# Patient Record
Sex: Female | Born: 1990
Health system: Southern US, Community
[De-identification: ages and names within clinical notes are randomized; demographics above are authoritative.]

## PROBLEM LIST (undated history)

## (undated) ENCOUNTER — Inpatient Hospital Stay (HOSPITAL_COMMUNITY): Payer: Self-pay

## (undated) DIAGNOSIS — A5601 Chlamydial cystitis and urethritis: Secondary | ICD-10-CM

## (undated) DIAGNOSIS — O139 Gestational [pregnancy-induced] hypertension without significant proteinuria, unspecified trimester: Secondary | ICD-10-CM

## (undated) DIAGNOSIS — Z8679 Personal history of other diseases of the circulatory system: Secondary | ICD-10-CM

## (undated) DIAGNOSIS — E041 Nontoxic single thyroid nodule: Secondary | ICD-10-CM

## (undated) DIAGNOSIS — D649 Anemia, unspecified: Secondary | ICD-10-CM

## (undated) DIAGNOSIS — R350 Frequency of micturition: Secondary | ICD-10-CM

## (undated) DIAGNOSIS — R011 Cardiac murmur, unspecified: Secondary | ICD-10-CM

## (undated) HISTORY — DX: Chlamydial cystitis and urethritis: A56.01

## (undated) HISTORY — DX: Gestational (pregnancy-induced) hypertension without significant proteinuria, unspecified trimester: O13.9

## (undated) HISTORY — DX: Nontoxic single thyroid nodule: E04.1

## (undated) HISTORY — PX: ASD REPAIR: SHX258

## (undated) HISTORY — DX: Frequency of micturition: R35.0

## (undated) HISTORY — DX: Cardiac murmur, unspecified: R01.1

## (undated) HISTORY — DX: Personal history of other diseases of the circulatory system: Z86.79

---
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
## 2010-05-24 ENCOUNTER — Other Ambulatory Visit
Admission: RE | Admit: 2010-05-24 | Discharge: 2010-05-24 | Payer: Self-pay | Source: Home / Self Care | Admitting: Family Medicine

## 2010-05-24 ENCOUNTER — Ambulatory Visit: Admission: RE | Admit: 2010-05-24 | Discharge: 2010-05-24 | Payer: Self-pay | Source: Home / Self Care

## 2010-05-24 ENCOUNTER — Encounter: Payer: Self-pay | Admitting: Family Medicine

## 2010-05-24 DIAGNOSIS — R350 Frequency of micturition: Secondary | ICD-10-CM

## 2010-05-24 HISTORY — DX: Frequency of micturition: R35.0

## 2010-05-24 LAB — CONVERTED CEMR LAB
Beta hcg, urine, semiquantitative: NEGATIVE
Bilirubin Urine: NEGATIVE
Blood in Urine, dipstick: NEGATIVE
Chlamydia, DNA Probe: POSITIVE — AB
GC Probe Amp, Genital: NEGATIVE
Glucose, Urine, Semiquant: NEGATIVE
Ketones, urine, test strip: NEGATIVE
Nitrite: NEGATIVE
Protein, U semiquant: NEGATIVE
Specific Gravity, Urine: 1.025
Urobilinogen, UA: 0.2
Whiff Test: POSITIVE
pH: 7

## 2010-05-27 LAB — GLUCOSE, CAPILLARY: Glucose-Capillary: 113 mg/dL — ABNORMAL HIGH (ref 70–99)

## 2010-05-28 ENCOUNTER — Encounter: Payer: Self-pay | Admitting: *Deleted

## 2010-05-28 ENCOUNTER — Ambulatory Visit
Admission: RE | Admit: 2010-05-28 | Discharge: 2010-05-28 | Payer: Self-pay | Source: Home / Self Care | Attending: Family Medicine | Admitting: Family Medicine

## 2010-05-28 DIAGNOSIS — A5601 Chlamydial cystitis and urethritis: Secondary | ICD-10-CM

## 2010-05-28 HISTORY — DX: Chlamydial cystitis and urethritis: A56.01

## 2010-06-11 NOTE — Assessment & Plan Note (Signed)
Summary: cold sxs/[place]   Vital Signs:  Patient profile:   20 year old female Weight:      158.9 pounds Temp:     98.6 degrees F oral Pulse rate:   66 / minute BP sitting:   109 / 69  (left arm) Cuff size:   regular  Vitals Entered By: Loralee Pacas CMA (June 14, 2009 3:31 PM)  Does patient need assistance? Functional Status Shopping Comments head congestion   History of Present Illness: 20 yo with 2 days history of nasal congestion.  Some sneezing, sore throat but no cough, sob, fever, nausea/vomting, diarrhea, or myalgias.  Has not tried much OTC.    Allergies: No Known Drug Allergies PMH-FH-SH reviewed-no changes except otherwise noted  Review of Systems      See HPI General:  Complains of fatigue; denies chills and fever. ENT:  Complains of nasal congestion, postnasal drainage, and sore throat; denies difficulty swallowing, hoarseness, and sinus pressure. CV:  Denies chest pain or discomfort. Resp:  Denies chest discomfort, cough, and shortness of breath. GI:  Denies abdominal pain, diarrhea, nausea, and vomiting.  Physical Exam  General:  Well-developed,well-nourished,in no acute distress; alert,appropriate and cooperative throughout examination Nose:  nasal congestion with boggy turbinates Mouth:  mild erythema with no tonsilar edema or exudate.  Evidence of post-nasal drip. Neck:  No deformities, masses, or tenderness noted. Lungs:  Normal respiratory effort, chest expands symmetrically. Lungs are clear to auscultation, no crackles or wheezes. Heart:  Normal rate and regular rhythm. S1 and S2 normal without gallop, murmur, click, rub or other extra sounds.   Impression & Recommendations:  Problem # 1:  URI (ICD-465.9)  URI with postnasal drip as most bothersome symptom.  Advised nasal saline, talked abotu OTC remedies as per instructions.  patient states she cannot afford anythign so picked claritin D which is on medicaid preferred list.  Her updated  medication list for this problem includes:    Claritin-d 12 Hour 5-120 Mg Xr12h-tab (Loratadine-pseudoephedrine) .Marland Kitchen... Take one tablet every 12 hours as needed for nasal congestion   Update:  received phone call from patient saying that pharmacy says it is no longer covered.  Advised to buy Claritin D over the counter.  Orders: [facility]- Est Level  3 (99213)  Complete Medication List: 1)  Loestrin 24 Fe 1-20 Mg-mcg Tabs (Norethin ace-eth estrad-fe) .... One daily 2)  Claritin-d 12 Hour 5-120 Mg Xr12h-tab (Loratadine-pseudoephedrine) .... Take one tablet every 12 hours as needed for nasal congestion  Patient Instructions: 1)  Try nasal saline rinse (neti pot) available at [facility]/ [facility]. 2)  May try decongestants +/- allergy medication ( i have prescribed Claritin-D) 3)  May try Afrin spray but not more then 2 days otherwise risk rebound symptoms. 4)  Stay well hydrated. Prescriptions: CLARITIN-D 12 HOUR 5-120 MG XR12H-TAB (LORATADINE-PSEUDOEPHEDRINE) take one tablet every 12 hours as needed for nasal congestion  #30 x 1   Entered and Authorized by:   Delbert Harness MD   Signed by:   Delbert Harness MD on 06/14/2009   Method used:   Print then Give to Patient   RxID:   [id number]

## 2010-06-11 NOTE — Assessment & Plan Note (Signed)
Summary: yeast inf,tcb   Vital Signs:  Patient profile:   20 year old female Weight:      154.6 pounds Temp:     98.9 degrees F oral Pulse rate:   80 / minute Pulse rhythm:   regular BP sitting:   130 / 83  (left arm) Cuff size:   regular  Vitals Entered By: Loralee Pacas CMA (August 23, 2009 9:47 AM)  History of Present Illness: 20 yo with one day of vaginal bumps.  + itching.  Feels irritated.  Sexually active with same female partner for 2.5 years.  He has no other partners.  No h/o same. Taking Loestrin daily.  No problems.  Had considered Implanon.    Allergies: No Known Drug Allergies  Social History: Occupation:Works at [employer], attends [employer].  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  vitals noted. Genitalia:  Normal external genitalia.  Fine papules at introitus with increased erythema of vagina.  No discharge noted. Speculum exam not performed.   Impression & Recommendations:  Problem # 1:  VAGINAL DISCHARGE (ICD-623.5) Due to yeast. Rx with fluconazole.  Pt med done on meds and disease.   Orders: Wet Prep- [facility] (16109) [facility]- Est Level  3 (60454)  Problem # 2:  CONTRACEPTIVE MANAGEMENT (ICD-V25.09)  Discussed Implanon, but pt does not want bleeding side effects.  Happy with Loestrin.  Will continue.  Orders: [facility]- Est Level  3 (99213)  Complete Medication List: 1)  Loestrin 24 Fe 1-20 Mg-mcg Tabs (Norethin ace-eth estrad-fe) .... One daily 2)  Claritin-d 12 Hour 5-120 Mg Xr12h-tab (Loratadine-pseudoephedrine) .... Take one tablet every 12 hours as needed for nasal congestion 3)  Fluconazole 150 Mg Tabs (Fluconazole) .... Take 1 pill now.  may repeat with another pill in 3 days if no relief  Patient Instructions: 1)  You  have a yeast infection.  Take the fluconazole.  If you are not better in 3 days, you may take the second one. 2)  Please call us if you have any concerns. 3)  Good luck with school! Prescriptions: FLUCONAZOLE 150 MG TABS (FLUCONAZOLE) Take 1 pill now.  May repeat with another pill in 3 days if no relief  #2 x 0   Entered and Authorized by:   Sarah Swaziland MD   Signed by:   Sarah Swaziland MD on 08/23/2009   Method used:   Electronically to        [facility] (retail)       [address]       [place], [place]  [id number]       Ph: [phone]       Fax: [phone]   RxID:   [id number]   Laboratory Results  Date/Time Received: August 23, 2009 10:07 AM  Date/Time Reported: August 23, 2009 10:13 AM   Wet [place] Source: vag WBC/hpf: 1-5 Bacteria/hpf: 2+  Rods Clue cells/hpf: none  Negative whiff Yeast/hpf: moderate Trichomonas/hpf: none Comments: ...............test performed by......Marland KitchenBonnie A. Swaziland, MLS (ASCP)cm

## 2010-06-11 NOTE — Consult Note (Signed)
Summary: [facility]   Imported By: Bradly Bienenstock 08/16/2009 12:03:37  _____________________________________________________________________  External Attachment:    Type:   Image     Comment:   External Document

## 2010-06-11 NOTE — Progress Notes (Signed)
Summary: Rx Prob  Phone Note Call from Patient   Caller: Patient Summary of Call: Pt at pharmacy and the pharmacy is saying they do not have her rx from today there as of yet. Initial call taken by: Clydell Hakim,  August 23, 2009 11:06 AM  Follow-up for Phone Call         called pharmacy and Rx verbally given to pharmacist for fluconazole . patient notified. Theresia Lo RN  August 23, 2009 11:35 AM  Follow-up by: Theresia Lo RN,  August 23, 2009 11:35 AM

## 2010-06-11 NOTE — Assessment & Plan Note (Signed)
Summary: sore throat,df   Vital Signs:  Patient profile:   20 year old female Height:      63.25 inches Weight:      162 pounds BMI:     28.57 BSA:     1.77 Temp:     98.3 degrees F Pulse rate:   69 / minute BP sitting:   119 / 74  Vitals Entered By: Jone Baseman CMA (December 26, 2009 11:50 AM) CC: sore throat, URI symptoms Is Patient Diabetic? No Pain Assessment Patient in pain? yes     Location: throat Intensity: 6   CC:  sore throat and URI symptoms.  History of Present Illness:       This is a 20 year old woman who presents with URI symptoms.  The patient complains of nasal congestion, sore throat, and sick contacts, but denies dry cough, productive cough, and earache.  Associated symptoms include low-grade fever (<100.5 degrees).  The patient denies stiff neck, dyspnea, wheezing, rash, vomiting, and diarrhea.  The patient also reports headache.  The patient denies itchy watery eyes, itchy throat, sneezing, seasonal symptoms, muscle aches, and severe fatigue.  The patient denies the following risk factors for Strep sinusitis: unilateral facial pain, tooth pain, and Strep exposure.    Habits & Providers  Alcohol-Tobacco-Diet     Tobacco Status: never  Current Problems (verified): 1)  Sore Throat  (ICD-462) 2)  Vaginal Discharge  (ICD-623.5) 3)  Uri  (ICD-465.9) 4)  Atrial Septal Defect, Hx of  (ICD-V12.59) 5)  Lesion, Face  (ICD-238.2) 6)  Thyroid Nodule  (ICD-241.0) 7)  Contraceptive Management  (ICD-V25.09)  Current Medications (verified): 1)  Loestrin 24 Fe 1-20 Mg-Mcg Tabs (Norethin Ace-Eth Estrad-Fe) .... One Daily 2)  Claritin-D 12 Hour 5-120 Mg Xr12h-Tab (Loratadine-Pseudoephedrine) .... Take One Tablet Every 12 Hours As Needed For Nasal Congestion 3)  Fluconazole 150 Mg Tabs (Fluconazole) .... Take 1 Pill Now.  May Repeat With Another Pill in 3 Days If No Relief  Allergies (verified): No Known Drug Allergies  Past History:  Past Medical  History: Last updated: 06/11/2009 Murmur  Past Surgical History: Last updated: 06/11/2009 ASD repair 2008  Social History: Last updated: 08/23/2009 Occupation:Works at [employer], attends [employer].  Wants to be a nurse practitioner Single Never Smoked Alcohol use-no Drug use-no Regular exercise-yes  Risk Factors: Exercise: yes (06/11/2009)  Risk Factors: Smoking Status: never (12/26/2009)  Review of Systems  The patient denies anorexia, weight loss, hoarseness, chest pain, syncope, dyspnea on exertion, prolonged cough, hemoptysis, abdominal pain, hematochezia, and severe indigestion/heartburn.    Physical Exam  General:  alert, well-developed, and well-nourished.   Head:  normocephalic and atraumatic.   Ears:  R ear normal and L ear normal.   Nose:  no external deformity and mucosal erythema.   Mouth:  good dentition, no exudates, pharyngeal erythema, and postnasal drip.   Neck:  supple.   Lungs:  normal respiratory effort and normal breath sounds.   Heart:  normal rate and regular rhythm.   Abdomen:  soft.     Impression & Recommendations:  Problem # 1:  SORE THROAT (ICD-462) Negative strep test Orders: Rapid Strep-[facility] (25366) [facility]- Est Level  3 (44034)  Problem # 2:  URI (ICD-465.9)  Her updated medication list for this problem includes:    Claritin-d 12 Hour 5-120 Mg Xr12h-tab (Loratadine-pseudoephedrine) .Marland Kitchen... Take one tablet every 12 hours as needed for nasal congestion  Orders: [facility]- Est Level  3 (74259)  Complete Medication List: 1)  Loestrin 24 Fe 1-20 Mg-mcg Tabs (Norethin ace-eth estrad-fe) .... One daily 2)  Claritin-d 12 Hour 5-120 Mg Xr12h-tab (Loratadine-pseudoephedrine) .... Take one tablet every 12 hours as needed for nasal congestion 3)  Fluconazole 150 Mg Tabs (Fluconazole) .... Take 1 pill now.  may repeat with another pill in 3 days if no relief  Patient Instructions: 1)  Please schedule a follow-up appointment as needed .   Laboratory  Results  Date/Time Received: December 26, 2009 11:47 AM  Date/Time Reported: December 26, 2009 11:57 AM   Other Tests  Rapid Strep: negative Comments: ...........test performed by...........Marland KitchenTerese Door, CMA

## 2010-06-11 NOTE — Progress Notes (Signed)
Summary: triage  Phone Note Call from Patient Call back at Home Phone [phone]   Caller: Patient Summary of Call: tired/stuffy nose/cough/lightheaded -wants to come in today Initial call taken by: De Nurse,  June 14, 2009 1:35 PM  Follow-up for Phone Call        she will be here by 3 for a work in. aware of wait Follow-up by: Golden Circle RN,  June 14, 2009 2:27 PM

## 2010-06-11 NOTE — Assessment & Plan Note (Signed)
Summary: np,df   Vital Signs:  Patient profile:   20 year old female Height:      63.25 inches Weight:      158.8 pounds BMI:     28.01 Pulse rate:   72 / minute BP sitting:   130 / 79  (right arm)  Vitals Entered By: Arlyss Repress CMA, (June 11, 2009 1:34 PM) CC: discuss birth control. has been on loestrin before, but wants implanon. LMP 05-25-09. uses condoms. has been sexually active since age 27. Is Patient Diabetic? No Pain Assessment Patient in pain? no        CC:  discuss birth control. has been on loestrin before and but wants implanon. LMP 05-25-09. uses condoms. has been sexually active since age 59.Marland Kitchen  History of Present Illness: Patient office for physical and discuss and to dicuss birth control, states she does not wish to re-implement Loestrin that she had been previously prescribed, wishes to have implanon inserted.  Currently sexually active, in monogamous relationship and denies vaginal d/c, irritation or abnormal vaginal bleeding.  Reports recurrent lesion to nose, that begans as a small itchy bump and develops into blisters that last for a couple of days, previously seen by out of town physician and was checked for HSV resulting negative.  States she has been using "cream" prescribed by previous physician.  Patient also reports she was seen in ER Nov 2010, for persistent chest pain, has a history of ASD repair in 2008, states that CT report showed nodules on her thyroid.  Denies heat/cold intolerances, difficulty swallowing, polyuria, or polydipsia.    Habits & Providers  Alcohol-Tobacco-Diet     Tobacco Status: never  Exercise-Depression-Behavior     Does Patient Exercise: yes     Drug Use: no  Current Medications (verified): 1)  Loestrin 24 Fe 1-20 Mg-Mcg Tabs (Norethin Ace-Eth Estrad-Fe) .... One Daily  Allergies (verified): No Known Drug Allergies  Past History:  Past Medical History: Murmur  Past Surgical History: ASD repair 2008  Family  History: Family History Hypertension  Social History: Occupation:Works at [employer], attends [employer] Single Never Smoked Alcohol use-no Drug use-no Regular exercise-yes Smoking Status:  never Occupation:  employed Drug Use:  no Does Patient Exercise:  yes  Review of Systems General:  Denies chills, fatigue, fever, loss of appetite, malaise, and weakness. Eyes:  Denies blurring, halos, and vision loss-both eyes. ENT:  Denies decreased hearing, difficulty swallowing, ear discharge, nasal congestion, postnasal drainage, sinus pressure, and sore throat. CV:  Denies chest pain or discomfort, difficulty breathing at night, fainting, fatigue, and shortness of breath with exertion. Resp:  Denies chest discomfort, cough, shortness of breath, and wheezing. GI:  Denies abdominal pain, change in bowel habits, constipation, excessive appetite, nausea, and vomiting. GU:  Denies abnormal vaginal bleeding, discharge, dysuria, and urinary frequency. MS:  Denies joint pain, loss of strength, and muscle weakness. Derm:  Complains of lesion(s); denies changes in color of skin, changes in nail beds, hair loss, and itching; recurrent bump on nose. Neuro:  Denies difficulty with concentration, numbness, poor balance, tingling, and visual disturbances. Psych:  Denies anxiety, depression, irritability, and panic attacks. Endo:  Denies cold intolerance, excessive hunger, excessive thirst, and heat intolerance. Heme:  Denies abnormal bruising, bleeding, and pallor. Allergy:  Denies itching eyes and seasonal allergies.  Physical Exam  General:  Well-developed,well-nourished,in no acute distress; alert,appropriate and cooperative throughout examination Head:  Normocephalic and atraumatic without obvious abnormalities. No apparent alopecia or balding. Eyes:  No corneal or  conjunctival inflammation noted. EOMI. Perrla. Vision grossly normal. Ears:  External ear exam shows no significant lesions or  deformities.  Otoscopic examination reveals moderate amount of cerumen bilaterally. Hearing is grossly normal bilaterally. Nose:  External nasal examination shows no deformity or inflammation. Nasal mucosa are pink and moist without lesions or exudates. Mouth:  Oral mucosa and oropharynx without lesions or exudates.  Teeth in good repair. Neck:  No deformities, masses, or tenderness noted. Chest Wall:  No deformities, masses, or tenderness noted. Breasts:  No mass, nodules, thickening, tenderness, bulging, retraction, inflamation, nipple discharge or skin changes noted.   Lungs:  Normal respiratory effort, chest expands symmetrically. Lungs are clear to auscultation, no crackles or wheezes. Heart:  Normal rate and regular rhythm. S1 and S2 normal without gallop, murmur, click, rub or other extra sounds. Abdomen:  Bowel sounds positive,abdomen soft and non-tender without masses, organomegaly or hernias noted. Rectal:  deferred Genitalia:  deferred Msk:  No deformity or scoliosis noted of thoracic or lumbar spine.   Pulses:  R and L carotid,radial,femoral,dorsalis pedis and posterior tibial pulses are full and equal bilaterally Extremities:  No clubbing, cyanosis, edema, or deformity noted with normal full range of motion of all joints.   Neurologic:  No cranial nerve deficits noted. Station and gait are normal. Plantar reflexes are down-going bilaterally. DTRs are symmetrical throughout. Sensory, motor and coordinative functions appear intact. Skin:  small, hyperpigmented area on nasal bridge, same texture as surrounding skin Cervical Nodes:  No lymphadenopathy noted Axillary Nodes:  No palpable lymphadenopathy Psych:  Cognition and judgment appear intact. Alert and cooperative with normal attention span and concentration. No apparent delusions, illusions, hallucinations    Impression & Recommendations:  Problem # 1:  CONTRACEPTIVE MANAGEMENT (ICD-V25.09) After discussing contraception  methods, patient decided to restart Loestrin, urine pregnancy negative, instructed to use back up method throughout first pack.  Orders: U Preg-[facility] ([id number])  Problem # 2:  THYROID NODULE (ICD-241.0) CT Scan results on 04/02/2009 shows "multinodular goiter".  Will obtain TSH to evaluate thyroid function, no masses palpated, will have patient follow up within 3 months.  Problem # 3:  LESION, FACE (ICD-238.2) Recurrent lesion to nasal bridge.  Will refer to Dermatology.  Complete Medication List: 1)  Loestrin 24 Fe 1-20 Mg-mcg Tabs (Norethin ace-eth estrad-fe) .... One daily  Other Orders: TSH-[facility] ([id number]) Dermatology Referral (Derm[facility])  Patient Instructions: 1)  Take birth control as prescribed. 2)  Follow up with Dermatology Referral.  We will call you with lab results and Derm appt. 3)  Return to office for follow up in 3 months. Prescriptions: LOESTRIN 24 FE 1-20 MG-MCG TABS (NORETHIN ACE-ETH ESTRAD-FE) one daily Brand medically necessary #1 x 11   Entered and Authorized by:   Luretha Murphy NP   Signed by:   Luretha Murphy NP on 06/11/2009   Method used:   Print then Give to Patient   RxID:   [id number]   Laboratory Results   Urine Tests  Date/Time Received: June 11, 2009 2:45 PM  Date/Time Reported: June 11, 2009 2:56 PM     Urine HCG: negative Comments: ...........test performed by...........Marland KitchenTerese Door, CMA     CBC   HGB:  14.6 g/dL   (Normal Range: 44.0-10.2 in Males, 12.0-15.0 in Females) Hct:  43.0 %   (Normal Range: 36.0-46.0)      CT of Chest  Procedure date:  03/29/2009  Findings:      Neg for PE, Cardiomegaly with right atrial enlargement, consistent with history of  ASD, multinnodular goiter

## 2010-06-13 NOTE — Assessment & Plan Note (Signed)
Summary: std tx,df  Nurse Visit   Allergies: No Known Drug Allergies  Medication Administration  Medication # 1:    Medication: Azithromycin oral    Diagnosis: CHLAMYDIAL INFECTION (ICD-099.41)    Dose: 1 gram    Route: po    Exp Date: 08/11/2011    Lot #: [id number]    Mfr: pfizer    Comments: patient advised to abstain from sex for 7 days , tell partner to be treated and always use condoms to prevent STD    Patient tolerated medication without complications    Given by: Theresia Lo RN (May 28, 2010 2:24 PM)  Orders Added: 1)  Azithromycin oral [Q0144] 2)  Est Level 1- [facility] [45409]   Medication Administration  Medication # 1:    Medication: Azithromycin oral    Diagnosis: CHLAMYDIAL INFECTION (ICD-099.41)    Dose: 1 gram    Route: po    Exp Date: 08/11/2011    Lot #: [id number]    Mfr: pfizer    Comments: patient advised to abstain from sex for 7 days , tell partner to be treated and always use condoms to prevent STD    Patient tolerated medication without complications    Given by: Theresia Lo RN (May 28, 2010 2:24 PM)  Orders Added: 1)  Azithromycin oral [Q0144] 2)  Est Level 1- [facility] [[id number]]

## 2010-06-13 NOTE — Assessment & Plan Note (Signed)
Summary: physical/pap/bmc   Vital Signs:  Patient profile:   20 year old female Height:      63.25 inches Weight:      174.56 pounds BMI:     30.79 BSA:     1.83 Temp:     98.7 degrees F Pulse rate:   76 / minute BP sitting:   120 / 78  Vitals Entered By: JessicaFleeger,CMA (May 24, 2010 10:49 AM) CC: physical Is Patient Diabetic? No Pain Assessment Patient in pain? no        CC:  physical.  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  Makes the Dean's list.  Has had a boyfriend for 4 years, sexually active for that time.  Had a PAP in the past that was abnormal.  Quit taking OCP as she had some breakthrough bleeding but had missed some pills.  Has a vaginal discharge.   Has been excessively thirsty and has had frequent urination.  SHe is trying to loose weight, going to the gym.  + DM in family.  Habits & Providers  Alcohol-Tobacco-Diet     Alcohol drinks/day: 0     Tobacco Status: never     Diet Counseling: to improve diet; diet is suboptimal  Exercise-Depression-Behavior     Does Patient Exercise: yes     Drug Use: never     Seat Belt Use: always  Allergies: No Known Drug Allergies  Social History: Drug Use:  never Risk analyst Use:  always  Review of Systems      See HPI  Physical Exam  General:  Well-developed,well-nourished,in no acute distress; alert,appropriate and cooperative throughout examination Ears:  R ear normal and L ear normal.   Nose:  nose piercing noted.   Mouth:  good dentition and pharynx pink and moist.   Neck:  no masses.   Lungs:  normal respiratory effort and normal breath sounds.   Heart:  normal rate, regular rhythm, and no murmur.   Abdomen:  soft, non-tender, and normal bowel sounds.   Genitalia:  normal introitus, no external lesions, mucosa pink and moist, no vaginal or cervical lesions, normal uterus size and position, no adnexal masses or tenderness, and  vaginal discharge.     Impression & Recommendations:  Problem # 1:  SCREENING FOR MALIGNANT NEOPLASM OF THE CERVIX (ICD-V76.2)  Orders: Pap Smear-[facility] (81191-47829) [facility] - Est  20-39 yrs (56213)  Problem # 2:  URINARY FREQUENCY (ICD-788.41) serum glucose non fasting was 113 (discussed a little high), clues in urine Orders: Urinalysis-[facility] (00000) Glucose Cap-[facility] (08657) [facility] - Est  20-39 yrs (84696)  Problem # 3:  VAGINITIS, BACTERIAL (ICD-616.10) await GC and CT Her updated medication list for this problem includes:    Metronidazole 500 Mg Tabs (Metronidazole) ..... One two times a day for 7 days  Problem # 4:  LESION, FACE (ICD-238.2) seen by derm and resolved  Problem # 5:  THYROID NODULE (ICD-241.0) incidental finding on CXR  Problem # 6:  CONTRACEPTIVE MANAGEMENT (ICD-V25.09) resume OCPs Orders: U Preg-[facility] (81025) [facility] - Est  20-39 yrs (29528)  Complete Medication List: 1)  Loestrin 24 Fe 1-20 Mg-mcg Tabs (Norethin ace-eth estrad-fe) .... One daily 2)  Metronidazole 500 Mg Tabs (Metronidazole) .... One two times a day for 7 days  [phone] GC/Chlamydia-[facility] (87591/87491)  Patient Instructions: 1)  If you could be exposed to sexually transmitted diseases. you should use a condom.  2)  If you are having sex and you or your partner don't want a child. Use contraception.  3)  You need to lose weight. Consider a lower calorie diet and regular exercise.  4)  It is important that you exercise reguarly at least 20 minutes 5 times a week. Prescriptions: METRONIDAZOLE 500 MG TABS (METRONIDAZOLE) one two times a day for 7 days  #14 x 0   Entered and Authorized by:   Luretha Murphy NP   Signed by:   Luretha Murphy NP on 05/24/2010   Method used:   Print then Give to Patient   RxID:   [id number] LOESTRIN 24 FE 1-20 MG-MCG TABS (NORETHIN ACE-ETH ESTRAD-FE) one daily  #1 x 11   Entered and Authorized by:   Luretha Murphy NP   Signed by:   Luretha Murphy NP  on 05/24/2010   Method used:   Print then Give to Patient   RxID:   [id number]    Orders Added: 1)  Urinalysis-[facility] [00000] 2)  Wet Prep- [facility] [87210] 3)  GC/Chlamydia-[facility] [87591/87491] 4)  Glucose Cap-[facility] [82948] 5)  U Preg-[facility] [81025] 6)  Pap Smear-[facility] [84132-44010] 7)  [facility] - Est  20-39 yrs [99395]    Laboratory Results   Urine Tests  Date/Time Received: May 24, 2010 10:55 AM  Date/Time Reported: May 24, 2010 11:47 AM   Routine Urinalysis   Color: yellow Appearance: Clear Glucose: negative   (Normal Range: Negative) Bilirubin: negative   (Normal Range: Negative) Ketone: negative   (Normal Range: Negative) Spec. Gravity: 1.025   (Normal Range: 1.003-1.035) Blood: negative   (Normal Range: Negative) pH: 7.0   (Normal Range: 5.0-8.0) Protein: negative   (Normal Range: Negative) Urobilinogen: 0.2   (Normal Range: 0-1) Nitrite: negative   (Normal Range: Negative) Leukocyte Esterace: trace   (Normal Range: Negative)  Urine Microscopic WBC/HPF: 10-20 RBC/HPF: 0-3 Bacteria/HPF: 2+ cocci Epithelial/HPF: 10-20 with mod #clue cells present    Urine HCG: negative Comments: ...............test performed by......Marland KitchenBonnie A. [place], MLS (ASCP)cm  Date/Time Received: May 24, 2010 10:55 AM  Date/Time Reported: May 24, 2010 11:48 AM   [place] Source: vag WBC/hpf: >20 Bacteria/hpf: 3+  Cocci Clue cells/hpf: many  Positive whiff Yeast/hpf: none Trichomonas/hpf: none Comments: ...............test performed by......Marland KitchenBonnie A. [place], MLS (ASCP)cm

## 2010-06-13 NOTE — Miscellaneous (Signed)
Summary: re: pos test/TS  called pt. informed of positive chlamydia. advised to have partner treated too. pt agreed. will come in for treatment on nurse visit today at 2:30pm.  Arlyss Repress CMA,  May 28, 2010 11:31 AM fwd. to [facility] for tx order   Administer Azithromycin 1 GM by mouth for treatment of CT infection Luretha Murphy NP  May 28, 2010 1:27 PM

## 2010-06-19 ENCOUNTER — Encounter: Payer: Self-pay | Admitting: *Deleted

## 2010-08-02 ENCOUNTER — Encounter: Payer: Self-pay | Admitting: Family Medicine

## 2010-08-02 ENCOUNTER — Ambulatory Visit (INDEPENDENT_AMBULATORY_CARE_PROVIDER_SITE_OTHER): Payer: PRIVATE HEALTH INSURANCE | Admitting: Family Medicine

## 2010-08-02 VITALS — BP 112/70 | HR 60 | Temp 98.5°F | Ht 63.0 in | Wt 164.0 lb

## 2010-08-02 DIAGNOSIS — N760 Acute vaginitis: Secondary | ICD-10-CM

## 2010-08-02 DIAGNOSIS — E041 Nontoxic single thyroid nodule: Secondary | ICD-10-CM

## 2010-08-02 DIAGNOSIS — J309 Allergic rhinitis, unspecified: Secondary | ICD-10-CM

## 2010-08-02 DIAGNOSIS — A5601 Chlamydial cystitis and urethritis: Secondary | ICD-10-CM

## 2010-08-02 DIAGNOSIS — Z8679 Personal history of other diseases of the circulatory system: Secondary | ICD-10-CM

## 2010-08-02 LAB — POCT WET PREP (WET MOUNT)
Trichomonas Wet Prep HPF POC: NEGATIVE
Yeast Wet Prep HPF POC: NEGATIVE

## 2010-08-02 MED ORDER — FLUTICASONE PROPIONATE 50 MCG/ACT NA SUSP: 1.0000 | Freq: Every day | NASAL | Status: DC

## 2010-08-02 NOTE — Assessment & Plan Note (Signed)
Wet prep negative.  

## 2010-08-02 NOTE — Assessment & Plan Note (Signed)
Percutaneous repair, encouraged her to see her cardiologist when she goes home this summer.  Do not feel that the non exertional chest pain on the right is at all relted to her past ASD.  Did caution her that if she ever has exertional pain during exercise that is a different story.  Did also stress that she will likely need long term follow up with Echocardiograms with a cardiologist.

## 2010-08-02 NOTE — Assessment & Plan Note (Signed)
Add nasal steroid. 

## 2010-08-02 NOTE — Patient Instructions (Addendum)
Multinodular goiter Percutaneous closure of ASD, mild atrial enlarged-make apt with cardiologist to find out the long term follow up schedule for you  Goiter Goiter is an enlarged thyroid gland. The thyroid gland sits at the base of the front of the neck. The gland produces important hormones for the body that regulate mood, body temperature, pulse rate, digestion, and many other functions. Most goiters are painless and are not a cause for serious concern. Goiters and conditions that cause goiters can be treated if necessary.  CAUSES  Common causes of goiter include:  Graves disease (causes too much hormone to be produced [hyperthyroidism]).   Hashimoto's disease (causes too little hormone to be produced [hypothyroidism]).   Thyroiditis (inflammation of the gland sometimes due to virus or pregnancy).   Nodular goiter (small bumps form; sometimes called toxic nodular goiter).   Pregnancy.   Thyroid cancer (very small percentage of goiters with nodules are cancerous).   Certain medications.   Radiation exposure.   Iodine deficiency (more common in developing countries in inland populations).  Risk factors for goiter include:  Having a family history of goiter.   Being female.   Inadequate iodine in the diet.   Being older than 40 years.  SYMPTOMS Many goiters do not cause symptoms. When they do occur, symptoms may include:  Swelling in the lower part of the neck. This swelling can range from a very small bump to a large lump.   A tight feeling in the throat.   A hoarse voice.  Less commonly, a goiter may result in:  Coughing.   Wheezing.   Difficulty swallowing.   Difficulty breathing.   Bulging neck veins.   Dizziness.  When a goiter is the result of an overactive thyroid (hyperthyroidism), symptoms may include:  Rapid or irregular heart beat.   Feeling sick to your stomach (nauseous).   Vomiting.   Diarrhea.   Shaking.   Irritable feeling.    Bulging eyes.   Weight loss.   Heat sensitivity.   Anxiety.  When a goiter is the result of an underactive thyroid (hypothyroidism), symptoms may include:  Tiredness.   Dry skin.   Constipation.   Weight gain.   Irregular menstrual cycle.   Depressed mood.   Sensitivity to cold.  DIAGNOSIS Tests used to diagnose goiter include:  A physical exam.   Blood tests, including thyroid hormone levels and antibody testing.   Ultrasonography, computerized X-ray scan (computed tomography, CT) or computerized magnetic scan (magnetic resonance imaging, MRI).   Thyroid scan (imaging along with safe radioactive injection).   Tissue sample taken (biopsy) of nodules. This is sometimes done to confirm that the nodules are not cancerous.  TREATMENT Treatment will depend on the cause of the goiter. Treatment may include:  Monitoring. In some cases, no treatment is necessary, and your doctor will monitor you at regular check ups.   Medications and supplements. Thyroid medication (thyroid hormone replacement) is available for overactive and underactive thyroids.   If inflammation is the cause, over-the-counter medication or steroid medication may be recommended.   Goiters caused by iodine deficiency can be treated with iodine supplements or changes in diet.   Radioactive iodine treatment. Radioactive iodine is injected into the blood. It travels to the thyroid gland, kills thyroid cells, and reduces the size of the gland. This is only used when the thyroid gland is overactive. Lifelong thyroid hormone medication is often necessary after this treatment.   Surgery. A procedure to remove all or part of  the gland may be recommended in severe cases or when cancer is the cause. Hormones can be taken to replace the hormones normally produced by the thyroid.  HOME CARE INSTRUCTIONS   Take medications as directed.   Follow your caregiver's recommendations for any dietary changes.   Follow  up with your caregiver for further examination and testing, as directed.  PROGNOSIS Once the cause of the goiter is known, treatment to relieve symptoms or manage underlying conditions is usually very successful. PREVENTION   If you have a family history of goiter, discuss screening with your doctor.   Make sure you are getting enough iodine in your diet.   Use of iodized table salt can help prevent iodine deficiency.  SEEK MEDICAL CARE IF YOU EXPERIENCE ANY SYMPTOMS OF GOITER SEEK IMMEDIATE MEDICAL CARE IF:  You have difficulty swallowing.   You have difficulty breathing.   You have bulging neck veins.   You experience dizziness.   You develop a rapid or irregular heart beat.  MAKE SURE YOU:   Understand these instructions.   Watch your condition.   Will get help right away if you are not doing well or get worse.  Not all test results may be available during your visit. If your test results are not back during the visit, make an appointment with your caregiver to find out the results. Do not assume everything is normal if you have not heard from your caregiver or the medical facility. It is important for you to follow up on all of your test results.  Document Released: 10/16/2009  [facility] Patient Information 2011 [place], [place].

## 2010-08-02 NOTE — Assessment & Plan Note (Signed)
Retested per patient request

## 2010-08-02 NOTE — Progress Notes (Signed)
  Subjective:    Patient ID: Anna Sexton, female    DOB: 10-03-1990, 20 y.o.   MRN: [id number]  HPI:  Wants to be check for STDs had CT infection and was treated several months ago.  She is still seeing her boyfriend.  She has occasional right sided chest pain that comes and goes, she worries that it is related to her ASD repair.  She has not follow up with her cardiologist at home like she should.  She went to the ER in November 2010 and had a CT angio of her chest as she was complaining of pain.  It showed enlarged atria related to ASD that was long standing before percutaneous repair.  Her chest pain is not exertional, remains confined to the area around her bra line on the right.    An incidental finding was nodular thyroid on the CT angio and she worries about this.  She reports that her Mother has nodules as well.  She is not sure where or what the situation is.  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  She has seasonal allergies, congestion and some post nasal drainage.    Review of Systems  Constitutional: Negative for fever, activity change, appetite change and fatigue.  HENT: Positive for congestion and postnasal drip.   Respiratory: Negative for shortness of breath and wheezing.   Cardiovascular: Positive for chest pain. Negative for palpitations and leg swelling.  Gastrointestinal: Negative for nausea, vomiting, abdominal pain, diarrhea and blood in stool.  Genitourinary: Negative for dysuria, urgency, frequency, vaginal bleeding, vaginal discharge, menstrual problem and pelvic pain.  Neurological: Negative for dizziness, syncope, light-headedness and headaches.  Psychiatric/Behavioral: Negative for dysphoric mood.       Objective:   Physical Exam  Constitutional: She appears well-developed and well-nourished.  Neck:       Small right thyroid nodule  Cardiovascular: Normal rate, regular rhythm and normal heart sounds.  Exam reveals  no friction rub.   No murmur heard. Pulmonary/Chest: Effort normal and breath sounds normal.  Genitourinary: Vagina normal.       No cervical discharge or CMT          Assessment & Plan:

## 2010-08-02 NOTE — Assessment & Plan Note (Signed)
Gave reassurance, and written information

## 2010-08-03 LAB — GC/CHLAMYDIA PROBE AMP, GENITAL
Chlamydia, DNA Probe: NEGATIVE
GC Probe Amp, Genital: NEGATIVE

## 2010-08-05 ENCOUNTER — Encounter: Payer: Self-pay | Admitting: Family Medicine

## 2010-08-14 LAB — URINALYSIS, ROUTINE W REFLEX MICROSCOPIC
Bilirubin Urine: NEGATIVE
Glucose, UA: NEGATIVE mg/dL
Hgb urine dipstick: NEGATIVE
Ketones, ur: NEGATIVE mg/dL
Nitrite: NEGATIVE
Protein, ur: NEGATIVE mg/dL
Specific Gravity, Urine: 1.029 (ref 1.005–1.030)
Urobilinogen, UA: 0.2 mg/dL (ref 0.0–1.0)
pH: 7 (ref 5.0–8.0)

## 2010-08-14 LAB — POCT I-STAT, CHEM 8
BUN: 10 mg/dL (ref 6–23)
Calcium, Ion: 1.21 mmol/L (ref 1.12–1.32)
Chloride: 101 mEq/L (ref 96–112)
Creatinine, Ser: 0.7 mg/dL (ref 0.4–1.2)
Glucose, Bld: 86 mg/dL (ref 70–99)
HCT: 43 % (ref 36.0–46.0)
Hemoglobin: 14.6 g/dL (ref 12.0–15.0)
Potassium: 3.9 mEq/L (ref 3.5–5.1)
Sodium: 141 mEq/L (ref 135–145)
TCO2: 28 mmol/L (ref 0–100)

## 2010-08-14 LAB — POCT CARDIAC MARKERS
CKMB, poc: <1 ng/mL — ABNORMAL LOW (ref 1.0–8.0)
Myoglobin, poc: 24.2 ng/mL (ref 12–200)
Troponin i, poc: <0.05 ng/mL (ref 0.00–0.09)

## 2010-08-14 LAB — URINE MICROSCOPIC-ADD ON

## 2010-08-14 LAB — URINE CULTURE
Colony Count: NO GROWTH
Culture: NO GROWTH

## 2010-08-14 LAB — PREGNANCY, URINE: Preg Test, Ur: NEGATIVE

## 2010-09-20 ENCOUNTER — Ambulatory Visit (INDEPENDENT_AMBULATORY_CARE_PROVIDER_SITE_OTHER): Payer: PRIVATE HEALTH INSURANCE | Admitting: Family Medicine

## 2010-09-20 ENCOUNTER — Encounter: Payer: Self-pay | Admitting: Family Medicine

## 2010-09-20 VITALS — BP 130/83 | HR 73 | Temp 99.1°F | Ht 63.0 in | Wt 161.1 lb

## 2010-09-20 DIAGNOSIS — N760 Acute vaginitis: Secondary | ICD-10-CM

## 2010-09-20 DIAGNOSIS — A5601 Chlamydial cystitis and urethritis: Secondary | ICD-10-CM

## 2010-09-20 DIAGNOSIS — R350 Frequency of micturition: Secondary | ICD-10-CM

## 2010-09-20 LAB — POCT URINALYSIS DIPSTICK
Bilirubin, UA: NEGATIVE
Blood, UA: NEGATIVE
Glucose, UA: NEGATIVE
Ketones, UA: NEGATIVE
Leukocytes, UA: NEGATIVE
Nitrite, UA: NEGATIVE
Protein, UA: NEGATIVE
Spec Grav, UA: 1.025
Urobilinogen, UA: 1
pH, UA: 6.5

## 2010-09-20 LAB — POCT WET PREP (WET MOUNT)
Trichomonas Wet Prep HPF POC: NEGATIVE
Yeast Wet Prep HPF POC: NEGATIVE

## 2010-09-20 LAB — POCT URINE PREGNANCY: Preg Test, Ur: NEGATIVE

## 2010-09-20 MED ORDER — METRONIDAZOLE 500 MG PO TABS: ORAL_TABLET | ORAL | Status: DC

## 2010-09-20 NOTE — Progress Notes (Signed)
  Subjective:    Patient ID: Anna Sexton, female    DOB: [birth year], 20 y.o.   MRN: [id number]  HPI  Vaginitis: Patient complains of an abnormal vaginal discharge for a few days. Vaginal symptoms include none.Vulvar symptoms include odor.STI Risk: Possible STD exposure.  Discharge described as: thick and malodorous.Other associated symptoms: discharge described as creamy and malodorous.Menstrual pattern: She had been bleeding regularly. Contraception: none, she uses condoms inconsistently and is sexually active with 2 partners.  She had STD in the past, with the same partner. Seh states that he was treated.  LMP 4/18 - 4/22.  She is supposed to take Loestrin for contraception but does not take it.    No fever, chills, abd pain, dysuria, vaginal bleeding, vomiting, nausea. +Frequency + odor to urine.  Review of Systems Per hpi     Objective:   Physical Exam  Constitutional: She appears well-developed and well-nourished. No distress.  Abdominal: Soft. Bowel sounds are normal. She exhibits no distension. There is no tenderness.  Genitourinary: Uterus normal. There is no rash or lesion on the right labia. There is no rash or lesion on the left labia. Cervix exhibits discharge. Cervix exhibits no motion tenderness and no friability. Right adnexum displays no mass and no tenderness. Left adnexum displays no mass and no tenderness. No tenderness or bleeding around the vagina. Vaginal discharge found.  Lymphadenopathy:       Right: No inguinal adenopathy present.       Left: No inguinal adenopathy present.          Assessment & Plan:

## 2010-09-21 LAB — GC/CHLAMYDIA PROBE AMP, GENITAL
Chlamydia, DNA Probe: NEGATIVE
GC Probe Amp, Genital: NEGATIVE

## 2010-09-21 LAB — RPR

## 2010-09-21 LAB — HIV ANTIBODY (ROUTINE TESTING W REFLEX): HIV: NONREACTIVE

## 2010-09-21 NOTE — Assessment & Plan Note (Signed)
UA negative for UTI

## 2010-09-21 NOTE — Assessment & Plan Note (Addendum)
Upreg negative.  Wet prep +clue cells.  Will treat with Flagyl 500mg  bid.  Advised against Etoh use.  Will check GC/Chlam since pt uses condoms inconsistently and has history of Chlam.  Will also check for HIV and RPR.

## 2010-09-21 NOTE — Assessment & Plan Note (Signed)
History of Chlamydia in the past, with the same partner.  Pt is at risk of STD.  Will check HIV, RPR.  Culture for GC/Chlam.

## 2010-11-22 ENCOUNTER — Ambulatory Visit (INDEPENDENT_AMBULATORY_CARE_PROVIDER_SITE_OTHER): Payer: Self-pay | Admitting: Family Medicine

## 2010-11-22 ENCOUNTER — Encounter: Payer: Self-pay | Admitting: Family Medicine

## 2010-11-22 DIAGNOSIS — N760 Acute vaginitis: Secondary | ICD-10-CM

## 2010-11-22 DIAGNOSIS — Z7251 High risk heterosexual behavior: Secondary | ICD-10-CM

## 2010-11-22 LAB — HIV ANTIBODY (ROUTINE TESTING W REFLEX): HIV: NONREACTIVE

## 2010-11-22 MED ORDER — METRONIDAZOLE 500 MG PO TABS: ORAL_TABLET | ORAL | Status: DC

## 2010-11-22 NOTE — Patient Instructions (Signed)
You have bacterial vaginosis- take flagyl as directed.  I will mail you your lab results.  I will call you if additional medication needed.  Return as needed

## 2010-11-22 NOTE — Progress Notes (Signed)
  Subjective:    Patient ID: Anna Sexton, female    DOB: [birth year], 20 y.o.   MRN: [id number]  HPI Vaginal discharge- X 1 week, white in color, no itching. No odor. Has had unprotected sex approx 2 week. Has 1 partner. Don't use condoms regularly.     Review of Systems no fever. no rash. no h/a.  no abd pain.    Objective:   Physical Exam  Constitutional: She is oriented to person, place, and time. She appears well-developed and well-nourished.  Cardiovascular: Normal rate, regular rhythm and normal heart sounds.   No murmur heard. Pulmonary/Chest: Effort normal. No respiratory distress.  Abdominal: Soft. She exhibits no distension. There is no tenderness. There is no rebound.  Genitourinary: Vagina normal and uterus normal. There is no rash or tenderness on the right labia. There is no rash or tenderness on the left labia. Cervix exhibits discharge. Cervix exhibits no motion tenderness and no friability. Right adnexum displays no mass, no tenderness and no fullness. Left adnexum displays no mass, no tenderness and no fullness.  Musculoskeletal: Normal range of motion. She exhibits no edema.  Neurological: She is alert and oriented to person, place, and time.  Skin: No rash noted.          Assessment & Plan:

## 2010-11-23 LAB — GC/CHLAMYDIA PROBE AMP, GENITAL
Chlamydia, DNA Probe: NEGATIVE
GC Probe Amp, Genital: NEGATIVE

## 2010-11-23 LAB — RPR

## 2010-11-25 ENCOUNTER — Encounter: Payer: Self-pay | Admitting: Family Medicine

## 2010-11-26 NOTE — Assessment & Plan Note (Signed)
Whiff test +, most likely bacterial vaginitis per wet prep.  Will send off GC/Chlam cultures.  Pt also tested for HIV and RPR.  Also discussed safe sex practices with pt.

## 2010-12-02 ENCOUNTER — Telehealth: Payer: Self-pay | Admitting: Family Medicine

## 2010-12-02 NOTE — Telephone Encounter (Signed)
Patient dropped off school form to be filled out asap, placed in K Foster's office for any clinical completion.

## 2010-12-03 NOTE — Telephone Encounter (Signed)
Form completed except for V & H.  Patient has not officially had a physical so information obtained from a [place] appointment this month.  Patient also has immunizations due.  Will call her and have her come in for a nurse visit for shots and vision, hearing.

## 2010-12-03 NOTE — Telephone Encounter (Signed)
Scheduled patient for a nurse visit for tomorrow afternoon.

## 2011-03-05 ENCOUNTER — Encounter: Payer: Self-pay | Admitting: *Deleted

## 2011-03-06 ENCOUNTER — Encounter: Payer: Self-pay | Admitting: Cardiology

## 2011-03-06 ENCOUNTER — Ambulatory Visit (INDEPENDENT_AMBULATORY_CARE_PROVIDER_SITE_OTHER): Payer: PRIVATE HEALTH INSURANCE | Admitting: Cardiology

## 2011-03-06 DIAGNOSIS — Z8679 Personal history of other diseases of the circulatory system: Secondary | ICD-10-CM

## 2011-03-06 DIAGNOSIS — R079 Chest pain, unspecified: Secondary | ICD-10-CM

## 2011-03-06 NOTE — Assessment & Plan Note (Signed)
I will bring the patient back for a POET (Plain Old Exercise Test). This will allow me to screen for obstructive coronary disease, risk stratify and very importantly provide a prescription for exercise.  If this and the echo are normal, I would not suggest further cardiac testing.

## 2011-03-06 NOTE — Assessment & Plan Note (Signed)
I suspect a stable repair but I will check an echocardiogram.

## 2011-03-06 NOTE — Progress Notes (Signed)
HPI The patient presents for evaluation of chest pain.  She has a history of an ASD repair at age 20.  This was at [facility] via a left thoracotomy.  She was not a candidate for a closure device.  She has had follow up with cardiology in the past with her last echo in 2009 or 10.  She had a normal childhood but apparently had a murmur and may have been diagnosed with an ASD at a young age. She had a sports physical that led to followup and treatment as a teenager. Prior to that she been able to do usual activities and sports without limitations. She is now a Theatre stage manager. She has been noticing some chest discomfort for several weeks. This is fleeting at times and sharp. It is sporadic. She may also get some pressure with activity such as climbing up the stairs. She is not exercising. She does get more short of breath than she thinks she should stairs but she also admits to being "out of shape". She does not describe any neck or arm discomfort. She had some exercise-induced rapid heart rates but no resting palpitations, presyncope or syncope. She has no PND or orthopnea. She has had no weight gain or edema.  No Known Allergies  Current Outpatient Prescriptions  Medication Sig Dispense Refill  . fluticasone (FLONASE) 50 MCG/ACT nasal spray 1 spray by Nasal route daily.  16 g  2  . metroNIDAZOLE (FLAGYL) 500 MG tablet 1 tab by mouth twice a day for 7 days  14 tablet  0  . Norethin Ace-Eth Estrad-FE (LOESTRIN 24 FE) 1-20 MG-MCG(24) TABS 1 tablet daily.          Past Medical History  Diagnosis Date  . ATRIAL SEPTAL DEFECT, HX OF 06/11/2009  . CHLAMYDIAL INFECTION 05/28/2010  . THYROID NODULE 06/11/2009  . Urinary frequency 05/24/2010    No past surgical history on file.  No family history on file.  History   Social History  . Marital Status: Single    Spouse Name: N/A    Number of Children: N/A  . Years of Education: N/A   Occupational History  . Not on file.   Social History Main  Topics  . Smoking status: Never Smoker   . Smokeless tobacco: Never Used  . Alcohol Use: Not on file  . Drug Use: Not on file  . Sexually Active: Not on file   Other Topics Concern  . Not on file   Social History Narrative  . No narrative on file    ROS:  Positive for headaches. Otherwise as stated in the HPI and negative for all other systems.   PHYSICAL EXAM BP 132/78  Pulse 72  Ht 5\' 3"  (1.6 m)  Wt 173 lb (78.472 kg)  BMI 30.65 kg/m2 GENERAL:  Well appearing HEENT:  Pupils equal round and reactive, fundi not visualized, oral mucosa unremarkable NECK:  No jugular venous distention, waveform within normal limits, carotid upstroke brisk and symmetric, no bruits, no thyromegaly LYMPHATICS:  No cervical, inguinal adenopathy LUNGS:  Clear to auscultation bilaterally BACK:  No CVA tenderness CHEST:  Left thoracotomy HEART:  PMI not displaced or sustained,S1 and S2 within normal limits, no S3, no S4, no clicks, no rubs, no murmurs ABD:  Flat, positive bowel sounds normal in frequency in pitch, no bruits, no rebound, no guarding, no midline pulsatile mass, no hepatomegaly, no splenomegaly EXT:  2 plus pulses throughout, no edema, no cyanosis no clubbing SKIN:  No  rashes no nodules NEURO:  Cranial nerves II through XII grossly intact, motor grossly intact throughout PSYCH:  Cognitively intact, oriented to person place and time  EKG:  Sinus rhythm, rate 75, axis within normal limits, intervals within normal limits, no acute ST-T wave changes.  ASSESSMENT AND PLAN

## 2011-03-06 NOTE — Patient Instructions (Signed)
Your physician has requested that you have an echocardiogram. Echocardiography is a painless test that uses sound waves to create images of your heart. It provides your doctor with information about the size and shape of your heart and how well your heart's chambers and valves are working. This procedure takes approximately one hour. There are no restrictions for this procedure.  Your physician has requested that you have an exercise tolerance test. For further information please visit [email or web address] Please also follow instruction sheet, as given.  The current medical regimen is effective;  continue present plan and medications.

## 2011-03-19 ENCOUNTER — Ambulatory Visit (INDEPENDENT_AMBULATORY_CARE_PROVIDER_SITE_OTHER): Payer: PRIVATE HEALTH INSURANCE | Admitting: Physician Assistant

## 2011-03-19 ENCOUNTER — Ambulatory Visit (HOSPITAL_COMMUNITY): Payer: PRIVATE HEALTH INSURANCE | Attending: Cardiovascular Disease | Admitting: Radiology

## 2011-03-19 DIAGNOSIS — I059 Rheumatic mitral valve disease, unspecified: Secondary | ICD-10-CM | POA: Insufficient documentation

## 2011-03-19 DIAGNOSIS — R0609 Other forms of dyspnea: Secondary | ICD-10-CM | POA: Insufficient documentation

## 2011-03-19 DIAGNOSIS — Z8679 Personal history of other diseases of the circulatory system: Secondary | ICD-10-CM

## 2011-03-19 DIAGNOSIS — R079 Chest pain, unspecified: Secondary | ICD-10-CM

## 2011-03-19 DIAGNOSIS — I079 Rheumatic tricuspid valve disease, unspecified: Secondary | ICD-10-CM | POA: Insufficient documentation

## 2011-03-19 DIAGNOSIS — I379 Nonrheumatic pulmonary valve disorder, unspecified: Secondary | ICD-10-CM | POA: Insufficient documentation

## 2011-03-19 DIAGNOSIS — R072 Precordial pain: Secondary | ICD-10-CM | POA: Insufficient documentation

## 2011-03-19 DIAGNOSIS — R0989 Other specified symptoms and signs involving the circulatory and respiratory systems: Secondary | ICD-10-CM | POA: Insufficient documentation

## 2011-03-19 NOTE — Progress Notes (Signed)
Exercise Treadmill Test  Pre-Exercise Testing Evaluation Rhythm: normal sinus  Rate: 60   PR:  .14 QRS:  .07  QT:  41 QTc: 41     Test  Exercise Tolerance Test Ordering MD: Angelina Sheriff, MD  Interpreting MD:  Tereso Newcomer PA-C  Unique Test No: 1  Treadmill:  1  Indication for ETT: chest pain - rule out ischemia  Contraindication to ETT: No   Stress Modality: exercise - treadmill  Cardiac Imaging Performed: non   Protocol: standard Bruce - maximal  Max BP: 176/86  Max MPHR (bpm):  200 85% MPR (bpm):  170  MPHR obtained (bpm):  173 % MPHR obtained: 86  Reached 85% MPHR (min:sec):  9:17 Total Exercise Time (min-sec):  10:46  Workload in METS:  12.9 Borg Scale: 19  Reason ETT Terminated:  patient's desire to stop    ST Segment Analysis At Rest: normal ST segments - no evidence of significant ST depression With Exercise: no evidence of significant ST depression  Other Information Arrhythmia:  No Angina during ETT:  Patient did report chest pain. Quality of ETT:  diagnostic  ETT Interpretation:  normal - no evidence of ischemia by ST analysis  Comments: Good exercise tolerance. Patient did report chest pain. Normal BP response to exercise. No ST-T changes to suggest ischemia.   Recommendations: Follow up with Dr. Antoine Poche as directed. We discussed a prescription for exercise today.

## 2011-03-26 ENCOUNTER — Telehealth: Payer: Self-pay | Admitting: Cardiology

## 2011-03-26 NOTE — Telephone Encounter (Signed)
LOV faxed to [facility] @ [phone]  03/26/11/km

## 2011-06-25 ENCOUNTER — Emergency Department (HOSPITAL_COMMUNITY)
Admission: EM | Admit: 2011-06-25 | Discharge: 2011-06-25 | Disposition: A | Discharge status: Home / Self Care | Payer: PRIVATE HEALTH INSURANCE | Attending: Emergency Medicine | Admitting: Emergency Medicine

## 2011-06-25 ENCOUNTER — Encounter (HOSPITAL_COMMUNITY): Payer: Self-pay | Admitting: Emergency Medicine

## 2011-06-25 DIAGNOSIS — W268XXA Contact with other sharp object(s), not elsewhere classified, initial encounter: Secondary | ICD-10-CM | POA: Insufficient documentation

## 2011-06-25 DIAGNOSIS — S61209A Unspecified open wound of unspecified finger without damage to nail, initial encounter: Secondary | ICD-10-CM | POA: Insufficient documentation

## 2011-06-25 DIAGNOSIS — S61219A Laceration without foreign body of unspecified finger without damage to nail, initial encounter: Secondary | ICD-10-CM

## 2011-06-25 MED ORDER — TRAMADOL HCL 50 MG PO TABS: 50.0000 mg | ORAL_TABLET | Freq: Four times a day (QID) | ORAL | Status: AC | PRN

## 2011-06-25 NOTE — ED Notes (Signed)
Pt was holding a glass plate that busted in her hand after it had been warmed up in the microwave. Laceration between her R ring finger and middle finger.

## 2011-06-25 NOTE — ED Provider Notes (Signed)
History     CSN: [id number]  Arrival date & time 06/25/11  2059   First MD Initiated Contact with Patient 06/25/11 2214      Chief Complaint  Patient presents with  . Extremity Laceration    (Consider location/radiation/quality/duration/timing/severity/associated sxs/prior treatment) HPI  21 year old female presenting to the ED with a chief complaints of hand laceration. Patient states she was removing a plate of food from microwaved that she just recently heated.  The glass date shot and the pain and caused laceration between the right ring finger and middle finger in the web space.  Patient denies tingling or numbness sensation distal to the affected site. States she is able to move all of her fingers. Her last tetanus shot is up-to-date.  She denies any other injury  Past Medical History  Diagnosis Date  . ATRIAL SEPTAL DEFECT, HX OF 06/11/2009  . CHLAMYDIAL INFECTION 05/28/2010  . THYROID NODULE 06/11/2009  . Urinary frequency 05/24/2010    Past Surgical History  Procedure Date  . Asd repair     2008    Family History  Problem Relation Age of Onset  . Diabetes insipidus Father     History  Substance Use Topics  . Smoking status: Never Smoker   . Smokeless tobacco: Never Used  . Alcohol Use: No    OB History    Grav Para Term Preterm Abortions TAB SAB Ect Mult Living                  Review of Systems  All other systems reviewed and are negative.    Allergies  Review of patient's allergies indicates no known allergies.  Home Medications  No current outpatient prescriptions on file.  BP 129/75  Pulse 63  Temp 99 F (37.2 C)  SpO2 100%  LMP 06/14/2011  Physical Exam  Nursing note and vitals reviewed. Constitutional: She appears well-nourished.  HENT:  Head: Atraumatic.  Eyes: Conjunctivae are normal.  Neck: Neck supple.  Neurological: She is alert.  Skin: Skin is warm.       ED Course  Procedures (including critical care time)  Labs  Reviewed - No data to display No results found.   No diagnosis found.  LACERATION REPAIR Performed by: Fayrene Helper Authorized byFayrene Helper Consent: Verbal consent obtained. Risks and benefits: risks, benefits and alternatives were discussed Consent given by: patient Patient identity confirmed: provided demographic data Prepped and Draped in normal sterile fashion Wound explored  Laceration Location: Right hand, webspace between ring finger and middle finger  Laceration Length: 2cm  No Foreign Bodies seen or palpated  Anesthesia: local infiltration  Local anesthetic: lidocaine 2% w/out epinephrine  Anesthetic total: 4 ml  Irrigation method: syringe Amount of cleaning: standard  Skin closure: prolene 5.0  Number of sutures: 4  Technique: simple interrupted  Patient tolerance: Patient tolerated the procedure well with no immediate complications.   MDM  Successful laceration repair.  Pt tolerates without difficulty.  Care instruction and followup instruction given.  Will prescribe abx due to hand laceration.     Medical screening examination/treatment/procedure(s) were performed by non-physician practitioner and as supervising physician I was immediately available for consultation/collaboration. Osvaldo Human, M.D.      Fayrene Helper, PA-C 06/25/11 2333  Carleene Cooper III, MD 06/26/11 1022

## 2011-06-25 NOTE — Discharge Instructions (Signed)
Tried to minimize finger movement for the next several days to decrease risk of breaking the suture. Return to ER in 7 days to have your suture removed. May take pain medication as described, do not drive or operate heavy machinery while taking pain medication as it can cause drowsiness.  Laceration Care, Adult A laceration is a cut or lesion that goes through all layers of the skin and into the tissue just beneath the skin. TREATMENT  Some lacerations may not require closure. Some lacerations may not be able to be closed due to an increased risk of infection. It is important to see your caregiver as soon as possible after an injury to minimize the risk of infection and maximize the opportunity for successful closure. If closure is appropriate, pain medicines may be given, if needed. The wound will be cleaned to help prevent infection. Your caregiver will use stitches (sutures), staples, wound glue (adhesive), or skin adhesive strips to repair the laceration. These tools bring the skin edges together to allow for faster healing and a better cosmetic outcome. However, all wounds will heal with a scar. Once the wound has healed, scarring can be minimized by covering the wound with sunscreen during the day for 1 full year. HOME CARE INSTRUCTIONS  For sutures or staples:  Keep the wound clean and dry.   If you were given a bandage (dressing), you should change it at least once a day. Also, change the dressing if it becomes wet or dirty, or as directed by your caregiver.   Wash the wound with soap and water 2 times a day. Rinse the wound off with water to remove all soap. Pat the wound dry with a clean towel.   After cleaning, apply a thin layer of the antibiotic ointment as recommended by your caregiver. This will help prevent infection and keep the dressing from sticking.   You may shower as usual after the first 24 hours. Do not soak the wound in water until the sutures are removed.   Only take  over-the-counter or prescription medicines for pain, discomfort, or fever as directed by your caregiver.   Get your sutures or staples removed as directed by your caregiver.  For skin adhesive strips:  Keep the wound clean and dry.   Do not get the skin adhesive strips wet. You may bathe carefully, using caution to keep the wound dry.   If the wound gets wet, pat it dry with a clean towel.   Skin adhesive strips will fall off on their own. You may trim the strips as the wound heals. Do not remove skin adhesive strips that are still stuck to the wound. They will fall off in time.  For wound adhesive:  You may briefly wet your wound in the shower or bath. Do not soak or scrub the wound. Do not swim. Avoid periods of heavy perspiration until the skin adhesive has fallen off on its own. After showering or bathing, gently pat the wound dry with a clean towel.   Do not apply liquid medicine, cream medicine, or ointment medicine to your wound while the skin adhesive is in place. This may loosen the film before your wound is healed.   If a dressing is placed over the wound, be careful not to apply tape directly over the skin adhesive. This may cause the adhesive to be pulled off before the wound is healed.   Avoid prolonged exposure to sunlight or tanning lamps while the skin adhesive is in  place. Exposure to ultraviolet light in the first year will darken the scar.   The skin adhesive will usually remain in place for 5 to 10 days, then naturally fall off the skin. Do not pick at the adhesive film.  You may need a tetanus shot if:  You cannot remember when you had your last tetanus shot.   You have never had a tetanus shot.  If you get a tetanus shot, your arm may swell, get red, and feel warm to the touch. This is common and not a problem. If you need a tetanus shot and you choose not to have one, there is a rare chance of getting tetanus. Sickness from tetanus can be serious. SEEK MEDICAL CARE  IF:   You have redness, swelling, or increasing pain in the wound.   You see a red line that goes away from the wound.   You have yellowish-white fluid (pus) coming from the wound.   You have a fever.   You notice a bad smell coming from the wound or dressing.   Your wound breaks open before or after sutures have been removed.   You notice something coming out of the wound such as wood or glass.   Your wound is on your hand or foot and you cannot move a finger or toe.  SEEK IMMEDIATE MEDICAL CARE IF:   Your pain is not controlled with prescribed medicine.   You have severe swelling around the wound causing pain and numbness or a change in color in your arm, hand, leg, or foot.   Your wound splits open and starts bleeding.   You have worsening numbness, weakness, or loss of function of any joint around or beyond the wound.   You develop painful lumps near the wound or on the skin anywhere on your body.  MAKE SURE YOU:   Understand these instructions.   Will watch your condition.   Will get help right away if you are not doing well or get worse.  Document Released: 04/28/2005 Document Revised: 01/08/2011 Document Reviewed: 10/22/2010 [facility] Patient Information 2012 [place], [place].

## 2011-09-03 ENCOUNTER — Telehealth: Payer: Self-pay | Admitting: *Deleted

## 2011-09-03 ENCOUNTER — Telehealth: Payer: Self-pay | Admitting: Cardiology

## 2011-09-03 NOTE — Telephone Encounter (Signed)
Anna Sexton 09/03/2011 1:08 PM Signed  Walk in pt Form " Pt Needs Letter stating she is able to Work?  Sending to Message Nurse 09/03/11/KM  Left message to call back and ask for triage since Pam RN out of office rest of the week No recent office visit and not sure what this is pertaining to.

## 2011-09-03 NOTE — Telephone Encounter (Signed)
Walk in pt Form " Pt Needs Letter stating she is able to Work? Sending to Message Nurse 09/03/11/KM

## 2011-09-04 ENCOUNTER — Encounter: Payer: Self-pay | Admitting: *Deleted

## 2011-09-04 NOTE — Telephone Encounter (Signed)
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 She did not find out until yesterday that she needed this note from Dr. Antoine Poche.   The note is due 09/05/11. I will forward to Dr. Antoine Poche. Mylo Red RN

## 2011-09-04 NOTE — Telephone Encounter (Signed)
Forwarded to triage, Hochrein patient

## 2011-10-09 ENCOUNTER — Ambulatory Visit: Payer: PRIVATE HEALTH INSURANCE | Admitting: Family Medicine

## 2011-10-15 ENCOUNTER — Encounter: Payer: Self-pay | Admitting: Family Medicine

## 2011-10-15 ENCOUNTER — Ambulatory Visit (INDEPENDENT_AMBULATORY_CARE_PROVIDER_SITE_OTHER): Payer: PRIVATE HEALTH INSURANCE | Admitting: Family Medicine

## 2011-10-15 VITALS — BP 115/74 | HR 68 | Temp 98.9°F | Ht 63.0 in | Wt 177.0 lb

## 2011-10-15 DIAGNOSIS — R51 Headache: Secondary | ICD-10-CM

## 2011-10-15 DIAGNOSIS — Z3009 Encounter for other general counseling and advice on contraception: Secondary | ICD-10-CM

## 2011-10-15 DIAGNOSIS — R21 Rash and other nonspecific skin eruption: Secondary | ICD-10-CM

## 2011-10-15 LAB — POCT SKIN KOH: Skin KOH, POC: NEGATIVE

## 2011-10-15 MED ORDER — NORETHINDRONE ACET-ETHINYL EST 1-20 MG-MCG PO TABS: 1.0000 | ORAL_TABLET | Freq: Every day | ORAL | Status: DC

## 2011-10-15 NOTE — Assessment & Plan Note (Signed)
Refilled Loestrin today.

## 2011-10-15 NOTE — Progress Notes (Signed)
  Subjective:    Patient ID: Anna Sexton, female    DOB: 08/25/1990, 21 y.o.   MRN: [id number]  HPI Family planning-patient was on birth control pills, Loestrin, before and did well on these. Should like to restart these. She denies discharge.  Headache-patient says that for the last 2 weeks she's had 4 days of headache per week. Usually her headaches, once a week. She thinks that they're stress related. She is not have any aura with these but she is not exam light-sensitive and gets nauseous. Usually a nap and Excedrin will help these. Sometimes it will come back to. She says that one trigger for her is running upstairs or other physical activity. These headaches have not changed in the many years that she's had them.  She is not had a headache since 2 days ago Rash-patient has noticed itchy, hypopigmented rash on the left side of her neck. She has not used anything for this. This rash is not getting worse or better. She does not have rash anywhere else.  Nonsmoker.   Review of Systems    Denies CP, SOB, N/V/D, fever  Objective:   Physical Exam  Vital signs reviewed General appearance - alert, well appearing, and in no distress Chest - clear to auscultation, no wheezes, rales or rhonchi, symmetric air entry, no tachypnea, retractions or cyanosis Heart - normal rate, regular rhythm, normal S1, S2, no murmurs, rubs, clicks or gallops Abdomen - soft, nontender, nondistended, no masses or organomegaly Neurological - alert, oriented, normal speech, no focal findings or movement disorder noted, screening mental status exam normal, neck supple without rigidity, cranial nerves II through XII intact Skin- several hypopigmented areas on the left side of the neck at the nape.     Assessment & Plan:

## 2011-10-15 NOTE — Assessment & Plan Note (Signed)
Skin scraping was negative less likely to be tinea versicolor. Will treat as eczema. Gave patient instructions for hydrocortisone as well as Vaseline.

## 2011-10-15 NOTE — Assessment & Plan Note (Signed)
Many years of headache, likely migraine. Plan have patient keep a headache diary as well as take Aleve for headaches.

## 2011-10-15 NOTE — Patient Instructions (Addendum)
Headache-for your headaches, I would like you to keep a diary of when you get them and what you think might trigger them. I would especially like you to write down if you had eaten anything that might trigger it like chocolate, caffeine -containing foods or mints or if you hadn't slept well the night before  I would like you to come back and see Dr. Edmonia James in the next 2-4 weeks to review her headache chart  When you get a headache, taken Aleve which may last a little bit longer   I have sent in your birth control pills  Your skin scraping was negative. You can treat this like eczema and put a little bit of over-the-counter hydrocortisone on it Put some gentle lotion or Vaseline on it

## 2011-10-20 ENCOUNTER — Encounter: Payer: Self-pay | Admitting: Family Medicine

## 2011-10-20 ENCOUNTER — Ambulatory Visit (INDEPENDENT_AMBULATORY_CARE_PROVIDER_SITE_OTHER): Payer: PRIVATE HEALTH INSURANCE | Admitting: Family Medicine

## 2011-10-20 ENCOUNTER — Other Ambulatory Visit (HOSPITAL_COMMUNITY)
Admission: RE | Admit: 2011-10-20 | Discharge: 2011-10-20 | Disposition: A | Discharge status: Home / Self Care | Payer: PRIVATE HEALTH INSURANCE | Source: Ambulatory Visit | Attending: Family Medicine | Admitting: Family Medicine

## 2011-10-20 VITALS — BP 119/71 | HR 67 | Ht 63.0 in | Wt 177.0 lb

## 2011-10-20 DIAGNOSIS — Z113 Encounter for screening for infections with a predominantly sexual mode of transmission: Secondary | ICD-10-CM | POA: Insufficient documentation

## 2011-10-20 DIAGNOSIS — R300 Dysuria: Secondary | ICD-10-CM

## 2011-10-20 DIAGNOSIS — L293 Anogenital pruritus, unspecified: Secondary | ICD-10-CM

## 2011-10-20 DIAGNOSIS — N898 Other specified noninflammatory disorders of vagina: Secondary | ICD-10-CM

## 2011-10-20 LAB — POCT UA - MICROSCOPIC ONLY

## 2011-10-20 LAB — POCT URINALYSIS DIPSTICK
Bilirubin, UA: NEGATIVE
Glucose, UA: NEGATIVE
Spec Grav, UA: 1.02

## 2011-10-20 LAB — POCT WET PREP (WET MOUNT)
Clue Cells Wet Prep Whiff POC: NEGATIVE
WBC, Wet Prep HPF POC: >20

## 2011-10-20 MED ORDER — FLUCONAZOLE 150 MG PO TABS: 150.0000 mg | ORAL_TABLET | Freq: Once | ORAL | Status: AC

## 2011-10-20 NOTE — Progress Notes (Signed)
  Subjective:    Patient ID: Anna Sexton, female    DOB: 1991-04-09, 21 y.o.   MRN: [id number]  HPI Burning with urination x 2-3 days.  No frequent urination. Irritation in vaginal area and with sex.  Notes chunky white vaginal discharge.  No recent antibiotics or new medications.  Had chlamydia in the past, nothing in the past year.  1 sexual partner, dating for past year.  Review of Systems No abdomina pain, fever    Objective:   Physical Exam Pelvic Exam:        External: normal female genitalia without lesions or masses.  Some irritation at vaginal opening at 6 o clock position.  No ulcers        Vagina: normal without lesions or masses        Cervix: normal without lesions or masses        Adnexa: normal bimanual exam without masses or fullness        Uterus: normal by palpation        Samples for Wet prep, GC/Chlamydia obtained        Assessment & Plan:

## 2011-10-20 NOTE — Patient Instructions (Signed)
   No trichomonas seen today.  Tests for gonorrhea and chlamydia are pending.  Will send you a letter.  Make appointment to meet your new doctor in 3 months for your annual physical.  Monilial Vaginitis Vaginitis in a soreness, swelling and redness (inflammation) of the vagina and vulva. Monilial vaginitis is not a sexually transmitted infection. CAUSES   Yeast vaginitis is caused by yeast (candida) that is normally found in your vagina. With a yeast infection, the candida has overgrown in number to a point that upsets the chemical balance. SYMPTOMS    White, thick vaginal discharge.   Swelling, itching, redness and irritation of the vagina and possibly the lips of the vagina (vulva).   Burning or painful urination.   Painful intercourse.  DIAGNOSIS   Things that may contribute to monilial vaginitis are:  Postmenopausal and virginal states.   Pregnancy.   Infections.   Being tired, sick or stressed, especially if you had monilial vaginitis in the past.   Diabetes. Good control will help lower the chance.   Birth control pills.   Tight fitting garments.   Using bubble bath, feminine sprays, douches or deodorant tampons.   Taking certain medications that kill germs (antibiotics).   Sporadic recurrence can occur if you become ill.  TREATMENT   Your caregiver will give you medication.  There are several kinds of anti monilial vaginal creams and suppositories specific for monilial vaginitis. For recurrent yeast infections, use a suppository or cream in the vagina 2 times a week, or as directed.   Anti-monilial or steroid cream for the itching or irritation of the vulva may also be used. Get your caregiver's permission.   Painting the vagina with methylene blue solution may help if the monilial cream does not work.   Eating yogurt may help prevent monilial vaginitis.  HOME CARE INSTRUCTIONS    Finish all medication as prescribed.   Do not have sex until treatment is  completed or after your caregiver tells you it is okay.   Take warm sitz baths.   Do not douche.   Do not use tampons, especially scented ones.   Wear cotton underwear.   Avoid tight pants and panty hose.   Tell your sexual partner that you have a yeast infection. They should go to their caregiver if they have symptoms such as mild rash or itching.   Your sexual partner should be treated as well if your infection is difficult to eliminate.   Practice safer sex. Use condoms.   Some vaginal medications cause latex condoms to fail. Vaginal medications that harm condoms are:   Cleocin cream.   Butoconazole (Femstat).   Terconazole (Terazol) vaginal suppository.   Miconazole (Monistat) (may be purchased over the counter).  SEEK MEDICAL CARE IF:    You have a temperature by mouth above 102 F (38.9 C).   The infection is getting worse after 2 days of treatment.   The infection is not getting better after 3 days of treatment.   You develop blisters in or around your vagina.   You develop vaginal bleeding, and it is not your menstrual period.   You have pain when you urinate.   You develop intestinal problems.   You have pain with sexual intercourse.  Document Released: 02/05/2005 Document Revised: 04/17/2011 Document Reviewed: 10/20/2008 [facility] Patient Information 2012 [place], [place].

## 2011-10-22 ENCOUNTER — Encounter: Payer: Self-pay | Admitting: Family Medicine

## 2011-11-18 ENCOUNTER — Encounter: Payer: Self-pay | Admitting: Family Medicine

## 2011-11-18 ENCOUNTER — Ambulatory Visit (INDEPENDENT_AMBULATORY_CARE_PROVIDER_SITE_OTHER): Payer: PRIVATE HEALTH INSURANCE | Admitting: Family Medicine

## 2011-11-18 VITALS — BP 130/85 | HR 71 | Temp 98.1°F | Ht 64.0 in | Wt 179.3 lb

## 2011-11-18 DIAGNOSIS — B839 Helminthiasis, unspecified: Secondary | ICD-10-CM

## 2011-11-18 MED ORDER — ALBENDAZOLE 200 MG PO TABS: 400.0000 mg | ORAL_TABLET | Freq: Two times a day (BID) | ORAL | Status: AC

## 2011-11-18 NOTE — Patient Instructions (Addendum)
Take Albenza 2 tablets twice a day this week, then 2 tablets next day. If you continue to see worms in your stool, bring in stool sample to office for culture. If you develop weight loss, decreased appetite, please call MD or return to clinic. If you develop altered mental status, seizures, or vomiting, please to go Emergency Room.

## 2011-11-18 NOTE — Assessment & Plan Note (Signed)
Patient did not bring in stool sample today, because she was so upset by what she saw in stool and flushed it. Will treat with Albendazole given history of travel outside country. If patient continues to see abnormalities in stool, gave her a specimen jar to place stool specimen for culture. Red flags reviewed per AVS.

## 2011-11-18 NOTE — Progress Notes (Signed)
  Subjective:    Patient ID: Anna Sexton, female    DOB: 08-28-1990, 21 y.o.   MRN: [id number]  HPI  Patient presents to clinic concerned she has tapeworm.  Patient looked down at her stool this morning and saw small, white string-like rods in her stool.  She denies any blood in stool or diarrhea.  She has traveled outside the country to [place] on a cruise 2 months ago and did eat foods on [address]  She also ate raw sushi last night for dinner, but denies any undercooked meats.  She noticed the abnormal stool today.  She denies any fever or seizure activity; denies fever, vomiting, or unintentional weight loss.  Patient is very anxious about it and tearful throughout encounter.  Review of Systems  Per HPI    Objective:   Physical Exam  Constitutional: No distress.       Tearful, anxious  Abdominal: Soft. She exhibits no distension. There is no tenderness.  Genitourinary: Rectum normal. Rectal exam shows no external hemorrhoid, no fissure, no mass and no tenderness.  Skin: Skin is dry. No rash noted. No erythema. No pallor.          Assessment & Plan:

## 2011-11-18 NOTE — Progress Notes (Deleted)
Subjective:     Patient ID: Anna Sexton, female   DOB: Sep 09, 1990, 21 y.o.   MRN: [id number]  HPI   Review of Systems     Objective:   Physical Exam     Assessment:     ***    Plan:     ***

## 2012-03-17 ENCOUNTER — Other Ambulatory Visit (HOSPITAL_COMMUNITY)
Admission: RE | Admit: 2012-03-17 | Discharge: 2012-03-17 | Disposition: A | Discharge status: Home / Self Care | Payer: BC Managed Care – PPO | Source: Ambulatory Visit | Attending: Family Medicine | Admitting: Family Medicine

## 2012-03-17 ENCOUNTER — Encounter: Payer: Self-pay | Admitting: Family Medicine

## 2012-03-17 ENCOUNTER — Ambulatory Visit (INDEPENDENT_AMBULATORY_CARE_PROVIDER_SITE_OTHER): Payer: BC Managed Care – PPO | Admitting: Family Medicine

## 2012-03-17 VITALS — BP 142/94 | HR 87 | Temp 99.7°F | Ht 63.0 in | Wt 180.6 lb

## 2012-03-17 DIAGNOSIS — Z1151 Encounter for screening for human papillomavirus (HPV): Secondary | ICD-10-CM | POA: Insufficient documentation

## 2012-03-17 DIAGNOSIS — M545 Low back pain: Secondary | ICD-10-CM

## 2012-03-17 DIAGNOSIS — Z01419 Encounter for gynecological examination (general) (routine) without abnormal findings: Secondary | ICD-10-CM | POA: Insufficient documentation

## 2012-03-17 DIAGNOSIS — Z124 Encounter for screening for malignant neoplasm of cervix: Secondary | ICD-10-CM

## 2012-03-17 DIAGNOSIS — Z113 Encounter for screening for infections with a predominantly sexual mode of transmission: Secondary | ICD-10-CM | POA: Insufficient documentation

## 2012-03-17 MED ORDER — NORGESTIMATE-ETH ESTRADIOL 0.25-35 MG-MCG PO TABS: 1.0000 | ORAL_TABLET | Freq: Every day | ORAL | Status: DC

## 2012-03-17 NOTE — Patient Instructions (Signed)
I will call or send you a letter with results of pap smear. For low back pain, please pick up Ibuprofen over the counter, take 2-3 tablets every 8 hours as needed for pain. Use ice or heating pads daily as well. If pain does not improve in 2 weeks, please return to clinic.  A new BC pill has been sent to your pharmacy.

## 2012-03-17 NOTE — Assessment & Plan Note (Signed)
May be secondary to weight vs. Prolonged sitting in nursing school.  Denies any paresthesias, urinary or bowel incontinence. - Plan to treat conservatively with ice, rest, and OTC Motrin as needed x 2 weeks - If no improvement in 2 weeks, patient to return to clinic for further work up

## 2012-03-17 NOTE — Addendum Note (Signed)
Addended by: Altamese Dilling A on: 03/17/2012 02:28 PM   Modules accepted: Orders

## 2012-03-17 NOTE — Progress Notes (Signed)
  Subjective:     Anna Sexton is a 21 y.o. female and is here for a comprehensive physical exam. The patient reports low back pain.  She says this a chronic problem.  It is difficult for patient to sit in class for 3 hours due to pain.  Pain scale 7/10.  Denies any numbness/tingling of extremities or difficulty bearing weight.  She has tried Tylenol without relief.  .  History   Social History  . Marital Status: Single    Spouse Name: N/A    Number of Children: 0  . Years of Education: N/A   Occupational History  . Student (Nursing)    Social History Main Topics  . Smoking status: Never Smoker   . Smokeless tobacco: Never Used  . Alcohol Use: No  . Drug Use: No  . Sexually Active: Yes    Birth Control/ Protection: Pill, Condom   Other Topics Concern  . Not on file   Social History Narrative  . No narrative on file   Health Maintenance  Topic Date Due  . Pap Smear  08/20/2008  . Tetanus/tdap  08/20/2009  . Influenza Vaccine  01/11/2012    The following portions of the patient's history were reviewed and updated as appropriate: current medications, past family history, past medical history, past social history and past surgical history.  Review of Systems Pertinent items are noted in HPI.   Objective:    General appearance: alert, cooperative and no distress Head: Normocephalic, without obvious abnormality, atraumatic Abdomen: soft, no distension, NT Pelvic: cervix normal in appearance, external genitalia normal, no adnexal masses or tenderness, no cervical motion tenderness, rectovaginal septum normal, uterus normal size, shape, and consistency and vagina normal without discharge Skin: Skin color, texture, turgor normal. No rashes or lesions Neurologic: Grossly normal    Assessment:    Healthy female exam with pap smear.  Low back pain.     Plan:     See Problem List.

## 2012-03-19 ENCOUNTER — Encounter: Payer: Self-pay | Admitting: Family Medicine

## 2012-04-02 ENCOUNTER — Telehealth: Payer: Self-pay | Admitting: Family Medicine

## 2012-04-02 NOTE — Telephone Encounter (Signed)
Discussed ASCUS result on pap smear, HPV negative.  Repeat pap in 3 years.

## 2012-10-11 ENCOUNTER — Ambulatory Visit (INDEPENDENT_AMBULATORY_CARE_PROVIDER_SITE_OTHER): Payer: BC Managed Care – PPO | Admitting: Family Medicine

## 2012-10-11 ENCOUNTER — Encounter: Payer: Self-pay | Admitting: Family Medicine

## 2012-10-11 VITALS — BP 149/82 | HR 77 | Temp 99.0°F | Ht 64.0 in | Wt 178.0 lb

## 2012-10-11 DIAGNOSIS — N912 Amenorrhea, unspecified: Secondary | ICD-10-CM

## 2012-10-11 DIAGNOSIS — Z3400 Encounter for supervision of normal first pregnancy, unspecified trimester: Secondary | ICD-10-CM | POA: Insufficient documentation

## 2012-10-11 DIAGNOSIS — Z3201 Encounter for pregnancy test, result positive: Secondary | ICD-10-CM

## 2012-10-11 DIAGNOSIS — Z3401 Encounter for supervision of normal first pregnancy, first trimester: Secondary | ICD-10-CM

## 2012-10-11 LAB — POCT URINE PREGNANCY: Preg Test, Ur: POSITIVE

## 2012-10-11 NOTE — Progress Notes (Signed)
  Subjective:    Patient ID: Liliane Shi, female    DOB: 01-Apr-1991, 22 y.o.   MRN: [id number]  HPI  Patient here for morning sickness.  She was taking OCP up until April.  She was going on vacation in May, so she stopped taking them.  She has had nausea/vomiting for about 2 weeks and two missed periods.  She took several  home pregnancy tests which were positive.  LMP was April 10th.  She is newly married, no children yet.  She is taking a prenatal vitamin already.  Denies any vaginal bleeding or abnormal discharge.  Denies any fever.  She does feel pelvic pressure.  Review of Systems Per HPI    Objective:   Physical Exam  Constitutional: She appears well-nourished.  Cardiovascular: Normal rate and normal heart sounds.   Pulmonary/Chest: Breath sounds normal.  Abdominal: Soft. Bowel sounds are normal. She exhibits no distension. There is no tenderness. There is no rebound and no guarding.  Skin: No rash noted.       Assessment & Plan:

## 2012-10-11 NOTE — Assessment & Plan Note (Signed)
UPT positive.  Will obtain routine prenatal labs today.  Patient to continue prenatal vitamin daily.  She is to schedule first OB visit with Dr. Claiborne Billings or Dr. Aviva Signs at her earliest convenience.  Handout given with red flags.

## 2012-10-11 NOTE — Patient Instructions (Addendum)
Please schedule first OB prenatal visit at your earliest convenience with either Dr. Aviva Signs or Dr. Claiborne Billings. Congratulations on your first pregnancy.  ABCs of Pregnancy A Antepartum care is very important. Be sure you see your doctor and get prenatal care as soon as you think you are pregnant. At this time, you will be tested for infection, genetic abnormalities and potential problems with you and the pregnancy. This is the time to discuss diet, exercise, work, medications, labor, pain medication during labor and the possibility of a cesarean delivery. Ask any questions that may concern you. It is important to see your doctor regularly throughout your pregnancy. Avoid exposure to toxic substances and chemicals - such as cleaning solvents, lead and mercury, some insecticides, and paint. Pregnant women should avoid exposure to paint fumes, and fumes that cause you to feel ill, dizzy or faint. When possible, it is a good idea to have a pre-pregnancy consultation with your caregiver to begin some important recommendations your caregiver suggests such as, taking folic acid, exercising, quitting smoking, avoiding alcoholic beverages, etc. B Breastfeeding is the healthiest choice for both you and your baby. It has many nutritional benefits for the baby and health benefits for the mother. It also creates a very tight and loving bond between the baby and mother. Talk to your doctor, your family and friends, and your employer about how you choose to feed your baby and how they can support you in your decision. Not all birth defects can be prevented, but a woman can take actions that may increase her chance of having a healthy baby. Many birth defects happen very early in pregnancy, sometimes before a woman even knows she is pregnant. Birth defects or abnormalities of any child in your or the father's family should be discussed with your caregiver. Get a good support bra as your breast size changes. Wear it especially  when you exercise and when nursing.  C Celebrate the news of your pregnancy with the your spouse/father and family. Childbirth classes are helpful to take for you and the spouse/father because it helps to understand what happens during the pregnancy, labor and delivery. Cesarean delivery should be discussed with your doctor so you are prepared for that possibility. The pros and cons of circumcision if it is a boy, should be discussed with your pediatrician. Cigarette smoking during pregnancy can result in low birth weight babies. It has been associated with infertility, miscarriages, tubal pregnancies, infant death (mortality) and poor health (morbidity) in childhood. Additionally, cigarette smoking may cause long-term learning disabilities. If you smoke, you should try to quit before getting pregnant and not smoke during the pregnancy. Secondary smoke may also harm a mother and her developing baby. It is a good idea to ask people to stop smoking around you during your pregnancy and after the baby is born. Extra calcium is necessary when you are pregnant and is found in your prenatal vitamin, in dairy products, green leafy vegetables and in calcium supplements. D A healthy diet according to your current weight and height, along with vitamins and mineral supplements should be discussed with your caregiver. Domestic abuse or violence should be made known to your doctor right away to get the situation corrected. Drink more water when you exercise to keep hydrated. Discomfort of your back and legs usually develops and progresses from the middle of the second trimester through to delivery of the baby. This is because of the enlarging baby and uterus, which may also affect your balance. Do  not take illegal drugs. Illegal drugs can seriously harm the baby and you. Drink extra fluids (water is best) throughout pregnancy to help your body keep up with the increases in your blood volume. Drink at least 6 to 8 glasses of  water, fruit juice, or milk each day. A good way to know you are drinking enough fluid is when your urine looks almost like clear water or is very light yellow.  E Eat healthy to get the nutrients you and your unborn baby need. Your meals should include the five basic food groups. Exercise (30 minutes of light to moderate exercise a day) is important and encouraged during pregnancy, if there are no medical problems or problems with the pregnancy. Exercise that causes discomfort or dizziness should be stopped and reported to your caregiver. Emotions during pregnancy can change from being ecstatic to depression and should be understood by you, your partner and your family. F Fetal screening with ultrasound, amniocentesis and monitoring during pregnancy and labor is common and sometimes necessary. Take 400 micrograms of folic acid daily both before, when possible, and during the first few months of pregnancy to reduce the risk of birth defects of the brain and spine. All women who could possibly become pregnant should take a vitamin with folic acid, every day. It is also important to eat a healthy diet with fortified foods (enriched grain products, including cereals, rice, breads, and pastas) and foods with natural sources of folate (orange juice, green leafy vegetables, beans, peanuts, broccoli, asparagus, peas, and lentils). The father should be involved with all aspects of the pregnancy including, the prenatal care, childbirth classes, labor, delivery, and postpartum time. Fathers may also have emotional concerns about being a father, financial needs, and raising a family. G Genetic testing should be done appropriately. It is important to know your family and the father's history. If there have been problems with pregnancies or birth defects in your family, report these to your doctor. Also, genetic counselors can talk with you about the information you might need in making decisions about having a family. You  can call a major medical center in your area for help in finding a board-certified genetic counselor. Genetic testing and counseling should be done before pregnancy when possible, especially if there is a history of problems in the mother's or father's family. Certain ethnic backgrounds are more at risk for genetic defects. H Get familiar with the hospital where you will be having your baby. Get to know how long it takes to get there, the labor and delivery area, and the hospital procedures. Be sure your medical insurance is accepted there. Get your home ready for the baby including, clothes, the baby's room (when possible), furniture and car seat. Hand washing is important throughout the day, especially after handling raw meat and poultry, changing the baby's diaper or using the bathroom. This can help prevent the spread of many bacteria and viruses that cause infection. Your hair may become dry and thinner, but will return to normal a few weeks after the baby is born. Heartburn is a common problem that can be treated by taking antacids recommended by your caregiver, eating smaller meals 5 or 6 times a day, not drinking liquids when eating, drinking between meals and raising the head of your bed 2 to 3 inches. I Insurance to cover you, the baby, doctor and hospital should be reviewed so that you will be prepared to pay any costs not covered by your insurance plan. If you  do not have medical insurance, there are usually clinics and services available for you in your community. Take 30 milligrams of iron during your pregnancy as prescribed by your doctor to reduce the risk of low red blood cells (anemia) later in pregnancy. All women of childbearing age should eat a diet rich in iron. J There should be a joint effort for the mother, father and any other children to adapt to the pregnancy financially, emotionally, and psychologically during the pregnancy. Join a support group for moms-to-be. Or, join a class on  parenting or childbirth. Have the family participate when possible. K Know your limits. Let your caregiver know if you experience any of the following:   Pain of any kind.  Strong cramps.  You develop a lot of weight in a short period of time (5 pounds in 3 to 5 days).  Vaginal bleeding, leaking of amniotic fluid.  Headache, vision problems.  Dizziness, fainting, shortness of breath.  Chest pain.  Fever of 102 F (38.9 C) or higher.  Gush of clear fluid from your vagina.  Painful urination.  Domestic violence.  Irregular heartbeat (palpitations).  Rapid beating of the heart (tachycardia).  Constant feeling sick to your stomach (nauseous) and vomiting.  Trouble walking, fluid retention (edema).  Muscle weakness.  If your baby has decreased activity.  Persistent diarrhea.  Abnormal vaginal discharge.  Uterine contractions at 20-minute intervals.  Back pain that travels down your leg. L Learn and practice that what you eat and drink should be in moderation and healthy for you and your baby. Legal drugs such as alcohol and caffeine are important issues for pregnant women. There is no safe amount of alcohol a woman can drink while pregnant. Fetal alcohol syndrome, a disorder characterized by growth retardation, facial abnormalities, and central nervous system dysfunction, is caused by a woman's use of alcohol during pregnancy. Caffeine, found in tea, coffee, soft drinks and chocolate, should also be limited. Be sure to read labels when trying to cut down on caffeine during pregnancy. More than 200 foods, beverages, and over-the-counter medications contain caffeine and have a high salt content! There are coffees and teas that do not contain caffeine. M Medical conditions such as diabetes, epilepsy, and high blood pressure should be treated and kept under control before pregnancy when possible, but especially during pregnancy. Ask your caregiver about any medications that  may need to be changed or adjusted during pregnancy. If you are currently taking any medications, ask your caregiver if it is safe to take them while you are pregnant or before getting pregnant when possible. Also, be sure to discuss any herbs or vitamins you are taking. They are medicines, too! Discuss with your doctor all medications, prescribed and over-the-counter, that you are taking. During your prenatal visit, discuss the medications your doctor may give you during labor and delivery. N Never be afraid to ask your doctor or caregiver questions about your health, the progress of the pregnancy, family problems, stressful situations, and recommendation for a pediatrician, if you do not have one. It is better to take all precautions and discuss any questions or concerns you may have during your office visits. It is a good idea to write down your questions before you visit the doctor. O Over-the-counter cough and cold remedies may contain alcohol or other ingredients that should be avoided during pregnancy. Ask your caregiver about prescription, herbs or over-the-counter medications that you are taking or may consider taking while pregnant.  P Physical activity during  pregnancy can benefit both you and your baby by lessening discomfort and fatigue, providing a sense of well-being, and increasing the likelihood of early recovery after delivery. Light to moderate exercise during pregnancy strengthens the belly (abdominal) and back muscles. This helps improve posture. Practicing yoga, walking, swimming, and cycling on a stationary bicycle are usually safe exercises for pregnant women. Avoid scuba diving, exercise at high altitudes (over 3000 feet), skiing, horseback riding, contact sports, etc. Always check with your doctor before beginning any kind of exercise, especially during pregnancy and especially if you did not exercise before getting pregnant. Q Queasiness, stomach upset and morning sickness are  common during pregnancy. Eating a couple of crackers or dry toast before getting out of bed. Foods that you normally love may make you feel sick to your stomach. You may need to substitute other nutritious foods. Eating 5 or 6 small meals a day instead of 3 large ones may make you feel better. Do not drink with your meals, drink between meals. Questions that you have should be written down and asked during your prenatal visits. R Read about and make plans to baby-proof your home. There are important tips for making your home a safer environment for your baby. Review the tips and make your home safer for you and your baby. Read food labels regarding calories, salt and fat content in the food. S Saunas, hot tubs, and steam rooms should be avoided while you are pregnant. Excessive high heat may be harmful during your pregnancy. Your caregiver will screen and examine you for sexually transmitted diseases and genetic disorders during your prenatal visits. Learn the signs of labor. Sexual relations while pregnant is safe unless there is a medical or pregnancy problem and your caregiver advises against it. T Traveling long distances should be avoided especially in the third trimester of your pregnancy. If you do have to travel out of state, be sure to take a copy of your medical records and medical insurance plan with you. You should not travel long distances without seeing your doctor first. Most airlines will not allow you to travel after 36 weeks of pregnancy. Toxoplasmosis is an infection caused by a parasite that can seriously harm an unborn baby. Avoid eating undercooked meat and handling cat litter. Be sure to wear gloves when gardening. Tingling of the hands and fingers is not unusual and is due to fluid retention. This will go away after the baby is born. U Womb (uterus) size increases during the first trimester. Your kidneys will begin to function more efficiently. This may cause you to feel the need to  urinate more often. You may also leak urine when sneezing, coughing or laughing. This is due to the growing uterus pressing against your bladder, which lies directly in front of and slightly under the uterus during the first few months of pregnancy. If you experience burning along with frequency of urination or bloody urine, be sure to tell your doctor. The size of your uterus in the third trimester may cause a problem with your balance. It is advisable to maintain good posture and avoid wearing high heels during this time. An ultrasound of your baby may be necessary during your pregnancy and is safe for you and your baby. V Vaccinations are an important concern for pregnant women. Get needed vaccines before pregnancy. Center for Disease Control ([employer]) has clear guidelines for the use of vaccines during pregnancy. Review the list, be sure to discuss it with your doctor. Prenatal vitamins  are helpful and healthy for you and the baby. Do not take extra vitamins except what is recommended. Taking too much of certain vitamins can cause overdose problems. Continuous vomiting should be reported to your caregiver. Varicose veins may appear especially if there is a family history of varicose veins. They should subside after the delivery of the baby. Support hose helps if there is leg discomfort. W Being overweight or underweight during pregnancy may cause problems. Try to get within 15 pounds of your ideal weight before pregnancy. Remember, pregnancy is not a time to be dieting! Do not stop eating or start skipping meals as your weight increases. Both you and your baby need the calories and nutrition you receive from a healthy diet. Be sure to consult with your doctor about your diet. There is a formula and diet plan available depending on whether you are overweight or underweight. Your caregiver or nutritionist can help and advise you if necessary. X Avoid X-rays. If you must have dental work or diagnostic  tests, tell your dentist or physician that you are pregnant so that extra care can be taken. X-rays should only be taken when the risks of not taking them outweigh the risk of taking them. If needed, only the minimum amount of radiation should be used. When X-rays are necessary, protective lead shields should be used to cover areas of the body that are not being X-rayed. Y Your baby loves you. Breastfeeding your baby creates a loving and very close bond between the two of you. Give your baby a healthy environment to live in while you are pregnant. Infants and children require constant care and guidance. Their health and safety should be carefully watched at all times. After the baby is born, rest or take a nap when the baby is sleeping. Z Get your ZZZs. Be sure to get plenty of rest. Resting on your side as often as possible, especially on your left side is advised. It provides the best circulation to your baby and helps reduce swelling. Try taking a nap for 30 to 45 minutes in the afternoon when possible. After the baby is born rest or take a nap when the baby is sleeping. Try elevating your feet for that amount of time when possible. It helps the circulation in your legs and helps reduce swelling.  Most information courtesy of the CDC. Document Released: 04/28/2005 Document Revised: 07/21/2011 Document Reviewed: 01/10/2009 [facility] Patient Information 2014 [place], [place].

## 2012-10-12 LAB — OBSTETRIC PANEL
Antibody Screen: NEGATIVE
Basophils Relative: 0 % (ref 0–1)
Eosinophils Absolute: 0.1 10*3/uL (ref 0.0–0.7)
Eosinophils Relative: 1 % (ref 0–5)
Hemoglobin: 13.2 g/dL (ref 12.0–15.0)
Hepatitis B Surface Ag: NEGATIVE
MCH: 28.3 pg (ref 26.0–34.0)
MCHC: 33.2 g/dL (ref 30.0–36.0)
MCV: 85.2 fL (ref 78.0–100.0)
Monocytes Absolute: 1.2 10*3/uL — ABNORMAL HIGH (ref 0.1–1.0)
Monocytes Relative: 11 % (ref 3–12)
Neutrophils Relative %: 73 % (ref 43–77)
Rh Type: POSITIVE

## 2012-10-12 LAB — CULTURE, OB URINE: Colony Count: NO GROWTH

## 2012-10-12 LAB — SICKLE CELL SCREEN: Sickle Cell Screen: POSITIVE — AB

## 2012-10-19 ENCOUNTER — Telehealth: Payer: Self-pay | Admitting: Family Medicine

## 2012-10-19 NOTE — Telephone Encounter (Signed)
Hi Renee, this patient has a new OB visit with you on 6/17. She was positive for sickle cell screen, so she will need to go for a hemoglobin electrophoresis after she sees you.  Thanks, Renee.

## 2012-10-19 NOTE — Telephone Encounter (Signed)
Message copied by DE LA CRUZ, IVY on Tue Oct 19, 2012  9:02 PM ------      Message from: [place], BONNIE      Created: Thu Oct 14, 2012 11:47 AM      Regarding: RE: OB sickle cell screen POS       I think we should follow with a hgb electrophoresis for confirmation.  Is the father a patient here? If so, we could test him - don't know faculty recommendation for this.  At delivery, baby's newborn screening will test for this, and if baby is S trait or disease, the state will request follow up blood from parents.  We would be one step ahead.       ----- Message -----         From: Barnabas Lister, DO         Sent: 10/13/2012  10:26 PM           To: Barbaraann Barthel, MD, Bonnie [place]      Subject: OB sickle cell screen POS                                Patient's initial prenatal lab screening came back POSITIVE for sickle cell.  Do I need to order a hemoglobin electrophoresis or does the father need to come in and be tested?  Please advise.            Thank you!            PS. Her initial OB visit will be on 6/17 with Dr. Claiborne Billings.       ------

## 2012-10-26 ENCOUNTER — Encounter: Payer: Self-pay | Admitting: Family Medicine

## 2012-10-26 ENCOUNTER — Ambulatory Visit (INDEPENDENT_AMBULATORY_CARE_PROVIDER_SITE_OTHER): Payer: BC Managed Care – PPO | Admitting: Family Medicine

## 2012-10-26 ENCOUNTER — Other Ambulatory Visit (HOSPITAL_COMMUNITY)
Admission: RE | Admit: 2012-10-26 | Discharge: 2012-10-26 | Disposition: A | Discharge status: Home / Self Care | Payer: BC Managed Care – PPO | Source: Ambulatory Visit | Attending: Family Medicine | Admitting: Family Medicine

## 2012-10-26 VITALS — BP 128/74 | Temp 99.4°F | Wt 178.0 lb

## 2012-10-26 DIAGNOSIS — Z130 Encounter for screening for diseases of the blood and blood-forming organs and certain disorders involving the immune mechanism: Secondary | ICD-10-CM

## 2012-10-26 DIAGNOSIS — Z01419 Encounter for gynecological examination (general) (routine) without abnormal findings: Secondary | ICD-10-CM | POA: Insufficient documentation

## 2012-10-26 DIAGNOSIS — Z113 Encounter for screening for infections with a predominantly sexual mode of transmission: Secondary | ICD-10-CM | POA: Insufficient documentation

## 2012-10-26 DIAGNOSIS — Z3400 Encounter for supervision of normal first pregnancy, unspecified trimester: Secondary | ICD-10-CM

## 2012-10-26 DIAGNOSIS — Z3401 Encounter for supervision of normal first pregnancy, first trimester: Secondary | ICD-10-CM

## 2012-10-26 NOTE — Patient Instructions (Addendum)

## 2012-10-26 NOTE — Assessment & Plan Note (Signed)
-   positive screen during initial OB encounter - Electrophoresis collected  - Will call patient with results

## 2012-10-26 NOTE — Progress Notes (Signed)
Subjective:    Anna Sexton is being seen today for her first obstetrical visit.  This is not a planned pregnancy, however it is welcomed. She is at approximately [redacted]w[redacted]d, unsure of dates. Her obstetrical history is significant for positive sickle cell screen. Relationship with FOB: spouse, living together. Patient does intend to breast feed. Pregnancy history fully reviewed.  Menstrual History: OB History   Grav Para Term Preterm Abortions TAB SAB Ect Mult Living   1               Menarche age: 33  Patient's last menstrual period was 08/16/2012.   The following portions of the patient's history were reviewed and updated as appropriate: allergies, current medications, past family history, past medical history, past social history, past surgical history and problem list.  Review of Systems Pertinent items are noted in HPI.  Nausea, vomit, breast tenderness, fatigue, frequent urination Objective:  BP 128/74  Temp(Src) 99.4 F (37.4 C)  Wt 178 lb (80.74 kg)  BMI 30.54 kg/m2  LMP 08/16/2012   BP 128/74  Temp(Src) 99.4 F (37.4 C)  Wt 178 lb (80.74 kg)  BMI 30.54 kg/m2  LMP 08/16/2012  General Appearance:    Alert, cooperative, no distress, appears stated age  Head:    Normocephalic, without obvious abnormality, atraumatic  Eyes:    PERRL, conjunctiva/corneas clear, EOM's intact, fundi    benign, both eyes     Nose:   Nares normal, septum midline, mucosa normal, no drainage      Throat:   Lips, mucosa, and tongue normal; teeth and gums normal  Neck:   Supple, symmetrical, trachea midline, no adenopathy;    thyroid:  no enlargement/tenderness/nodules; no carotid   bruit or JVD  Back:     Symmetric, no curvature, ROM normal, no CVA tenderness  Lungs:     Clear to auscultation bilaterally, respirations unlabored  Chest Wall:    No tenderness or deformity   Heart:    Regular rate and rhythm, S1 and S2 normal, no murmur   appreciated  Breast Exam:    No tenderness, masses, or  nipple abnormality  Abdomen:     Soft, non-tender, bowel sounds active all four quadrants,    no masses, no organomegaly  Genitalia:    Normal female without lesion, discharge or tenderness. Cervix appears normal, non-friable without lesions. Mild white discharge, no odor.     Extremities:   Extremities normal, atraumatic, no cyanosis or edema  Pulses:   2+ and symmetric all extremities  Skin:   Skin color, texture, turgor normal, no rashes or lesions  Lymph nodes:   Cervical, supraclavicular, and axillary nodes normal  Neurologic:   CNII-XII grossly intact, normal strength, sensation and reflexes    Throughout       Assessment:    Normal first Pregnancy at ~10 and 1/7 weeks   FHR appreciated: 150's Plan:    Initial labs drawn and reviewed, positive screen for sickle cell. Patient was unaware of condition possibility, but reports she has many pain attacks as a child. Will send for electrophoresis.  Varicella titer today Prenatal vitamins. Problem list reviewed and updated. AFP3 discussed: declined. Amniocentesis discussed: declined. PMH papers will need filled out next visit Follow up in 4 weeks with one hour Glucola (early) for family history

## 2012-10-28 LAB — HEMOGLOBINOPATHY EVALUATION
Hgb A: 97 % (ref 96.8–97.8)
Hgb S Quant: 0 %

## 2012-10-30 ENCOUNTER — Telehealth: Payer: Self-pay | Admitting: Family Medicine

## 2012-10-30 NOTE — Telephone Encounter (Signed)
Please call patient and inform her that the results to her Sickle cell test were negative, she does not have sickle cell disease. She also tested immune to chicken pox, so she either did have vaccine or outbreak at one point in her life. We will discuss her labs further on her next visit.

## 2012-11-01 ENCOUNTER — Ambulatory Visit (HOSPITAL_COMMUNITY)
Admission: RE | Admit: 2012-11-01 | Discharge: 2012-11-01 | Disposition: A | Discharge status: Home / Self Care | Payer: BC Managed Care – PPO | Source: Ambulatory Visit | Attending: Family Medicine | Admitting: Family Medicine

## 2012-11-01 DIAGNOSIS — Z3401 Encounter for supervision of normal first pregnancy, first trimester: Secondary | ICD-10-CM

## 2012-11-01 DIAGNOSIS — Z3689 Encounter for other specified antenatal screening: Secondary | ICD-10-CM | POA: Insufficient documentation

## 2012-11-01 NOTE — Telephone Encounter (Signed)
Pt given results, see note below.  Pt had U/S done today and is VERY anxious to hear about the results.  She is "so excited and nervous" at the same.  Her next appt is not until 11/26/2012 and pt would like to hear something before then.  I reassured her that once Dr. Claiborne Billings reviewed the u/s that I would give her a call back.  Pt was very appreciative and polite.  Raeya Merritts, Darlyne Russian, CMA

## 2012-11-01 NOTE — Telephone Encounter (Signed)
LM on unidentified voicemail for patient to call her doctor's office.  Ferguson Gertner, Darlyne Russian, CMA

## 2012-11-09 ENCOUNTER — Telehealth: Payer: Self-pay | Admitting: Family Medicine

## 2012-11-09 NOTE — Telephone Encounter (Signed)
Patient is calling for the results of her ultrasound from 6/23.

## 2012-11-26 ENCOUNTER — Encounter: Payer: Self-pay | Admitting: Family Medicine

## 2012-11-26 ENCOUNTER — Ambulatory Visit (INDEPENDENT_AMBULATORY_CARE_PROVIDER_SITE_OTHER): Payer: BC Managed Care – PPO | Admitting: Family Medicine

## 2012-11-26 VITALS — BP 124/73 | Temp 99.4°F | Wt 177.0 lb

## 2012-11-26 DIAGNOSIS — Z3401 Encounter for supervision of normal first pregnancy, first trimester: Secondary | ICD-10-CM

## 2012-11-26 DIAGNOSIS — Z3400 Encounter for supervision of normal first pregnancy, unspecified trimester: Secondary | ICD-10-CM

## 2012-11-26 LAB — GLUCOSE, CAPILLARY
Comment 1: 1
Glucose-Capillary: 106 mg/dL — ABNORMAL HIGH (ref 70–99)

## 2012-11-26 NOTE — Progress Notes (Signed)
Anna Sexton is 22 y.o. G1P0 here today for her routine prenatal @ [redacted]w[redacted]d.She is feeling well. Father of baby with her today (Will). 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   O: BP 124/73  Temp(Src) 99.4 F (37.4 C)  Wt 177 lb (80.287 kg)  BMI 30.37 kg/m2  LMP 08/16/2012 Abd: Gravid. Fundal height below umbilicus as expected.   A/P:  - FHR 155 - 1 hr GTT today for family history - Electrophoresis negative for Sickle cell - PAP normal  - IgG for varicella high - PMH papers filled out  - F/U: 4 weeks (will schedule Anatomy US at that time).

## 2012-11-26 NOTE — Patient Instructions (Signed)
Pregnancy - Second Trimester The second trimester of pregnancy (3 to 6 months) is a period of rapid growth for you and your baby. At the end of the sixth month, your baby is about 9 inches long and weighs 1 1/2 pounds. You will begin to feel the baby move between 18 and 20 weeks of the pregnancy. This is called quickening. Weight gain is faster. A clear fluid (colostrum) may leak out of your breasts. You may feel small contractions of the womb (uterus). This is known as false labor or Braxton-Hicks contractions. This is like a practice for labor when the baby is ready to be born. Usually, the problems with morning sickness have usually passed by the end of your first trimester. Some women develop small dark blotches (called cholasma, mask of pregnancy) on their face that usually goes away after the baby is born. Exposure to the sun makes the blotches worse. Acne may also develop in some pregnant women and pregnant women who have acne, may find that it goes away. PRENATAL EXAMS  Blood work may continue to be done during prenatal exams. These tests are done to check on your health and the probable health of your baby. Blood work is used to follow your blood levels (hemoglobin). Anemia (low hemoglobin) is common during pregnancy. Iron and vitamins are given to help prevent this. You will also be checked for diabetes between 24 and 28 weeks of the pregnancy. Some of the previous blood tests may be repeated.  The size of the uterus is measured during each visit. This is to make sure that the baby is continuing to grow properly according to the dates of the pregnancy.  Your blood pressure is checked every prenatal visit. This is to make sure you are not getting toxemia.  Your urine is checked to make sure you do not have an infection, diabetes or protein in the urine.  Your weight is checked often to make sure gains are happening at the suggested rate. This is to ensure that both you and your baby are  growing normally.  Sometimes, an ultrasound is performed to confirm the proper growth and development of the baby. This is a test which bounces harmless sound waves off the baby so your caregiver can more accurately determine due dates. Sometimes, a test is done on the amniotic fluid surrounding the baby. This test is called an amniocentesis. The amniotic fluid is obtained by sticking a needle into the belly (abdomen). This is done to check the chromosomes in instances where there is a concern about possible genetic problems with the baby. It is also sometimes done near the end of pregnancy if an early delivery is required. In this case, it is done to help make sure the baby's lungs are mature enough for the baby to live outside of the womb. CHANGES OCCURING IN THE SECOND TRIMESTER OF PREGNANCY Your body goes through many changes during pregnancy. They vary from person to person. Talk to your caregiver about changes you notice that you are concerned about.  During the second trimester, you will likely have an increase in your appetite. It is normal to have cravings for certain foods. This varies from person to person and pregnancy to pregnancy.  Your lower abdomen will begin to bulge.  You may have to urinate more often because the uterus and baby are pressing on your bladder. It is also common to get more bladder infections during pregnancy. You can help this by drinking lots of fluids   and emptying your bladder before and after intercourse.  You may begin to get stretch marks on your hips, abdomen, and breasts. These are normal changes in the body during pregnancy. There are no exercises or medicines to take that prevent this change.  You may begin to develop swollen and bulging veins (varicose veins) in your legs. Wearing support hose, elevating your feet for 15 minutes, 3 to 4 times a day and limiting salt in your diet helps lessen the problem.  Heartburn may develop as the uterus grows and  pushes up against the stomach. Antacids recommended by your caregiver helps with this problem. Also, eating smaller meals 4 to 5 times a day helps.  Constipation can be treated with a stool softener or adding bulk to your diet. Drinking lots of fluids, and eating vegetables, fruits, and whole grains are helpful.  Exercising is also helpful. If you have been very active up until your pregnancy, most of these activities can be continued during your pregnancy. If you have been less active, it is helpful to start an exercise program such as walking.  Hemorrhoids may develop at the end of the second trimester. Warm sitz baths and hemorrhoid cream recommended by your caregiver helps hemorrhoid problems.  Backaches may develop during this time of your pregnancy. Avoid heavy lifting, wear low heal shoes, and practice good posture to help with backache problems.  Some pregnant women develop tingling and numbness of their hand and fingers because of swelling and tightening of ligaments in the wrist (carpel tunnel syndrome). This goes away after the baby is born.  As your breasts enlarge, you may have to get a bigger bra. Get a comfortable, cotton, support bra. Do not get a nursing bra until the last month of the pregnancy if you will be nursing the baby.  You may get a dark line from your belly button to the pubic area called the linea nigra.  You may develop rosy cheeks because of increase blood flow to the face.  You may develop spider looking lines of the face, neck, arms, and chest. These go away after the baby is born. HOME CARE INSTRUCTIONS   It is extremely important to avoid all smoking, herbs, alcohol, and unprescribed drugs during your pregnancy. These chemicals affect the formation and growth of the baby. Avoid these chemicals throughout the pregnancy to ensure the delivery of a healthy infant.  Most of your home care instructions are the same as suggested for the first trimester of your  pregnancy. Keep your caregiver's appointments. Follow your caregiver's instructions regarding medicine use, exercise, and diet.  During pregnancy, you are providing food for you and your baby. Continue to eat regular, well-balanced meals. Choose foods such as meat, fish, milk and other low fat dairy products, vegetables, fruits, and whole-grain breads and cereals. Your caregiver will tell you of the ideal weight gain.  A physical sexual relationship may be continued up until near the end of pregnancy if there are no other problems. Problems could include early (premature) leaking of amniotic fluid from the membranes, vaginal bleeding, abdominal pain, or other medical or pregnancy problems.  Exercise regularly if there are no restrictions. Check with your caregiver if you are unsure of the safety of some of your exercises. The greatest weight gain will occur in the last 2 trimesters of pregnancy. Exercise will help you:  Control your weight.  Get you in shape for labor and delivery.  Lose weight after you have the baby.  Wear   a good support or jogging bra for breast tenderness during pregnancy. This may help if worn during sleep. Pads or tissues may be used in the bra if you are leaking colostrum.  Do not use hot tubs, steam rooms or saunas throughout the pregnancy.  Wear your seat belt at all times when driving. This protects you and your baby if you are in an accident.  Avoid raw meat, uncooked cheese, cat litter boxes, and soil used by cats. These carry germs that can cause birth defects in the baby.  The second trimester is also a good time to visit your dentist for your dental health if this has not been done yet. Getting your teeth cleaned is okay. Use a soft toothbrush. Brush gently during pregnancy.  It is easier to leak urine during pregnancy. Tightening up and strengthening the pelvic muscles will help with this problem. Practice stopping your urination while you are going to the  bathroom. These are the same muscles you need to strengthen. It is also the muscles you would use as if you were trying to stop from passing gas. You can practice tightening these muscles up 10 times a set and repeating this about 3 times per day. Once you know what muscles to tighten up, do not perform these exercises during urination. It is more likely to contribute to an infection by backing up the urine.  Ask for help if you have financial, counseling, or nutritional needs during pregnancy. Your caregiver will be able to offer counseling for these needs as well as refer you for other special needs.  Your skin may become oily. If so, wash your face with mild soap, use non-greasy moisturizer and oil or cream based makeup. MEDICINES AND DRUG USE IN PREGNANCY  Take prenatal vitamins as directed. The vitamin should contain 1 milligram of folic acid. Keep all vitamins out of reach of children. Only a couple vitamins or tablets containing iron may be fatal to a baby or young child when ingested.  Avoid use of all medicines, including herbs, over-the-counter medicines, not prescribed or suggested by your caregiver. Only take over-the-counter or prescription medicines for pain, discomfort, or fever as directed by your caregiver. Do not use aspirin.  Let your caregiver also know about herbs you may be using.  Alcohol is related to a number of birth defects. This includes fetal alcohol syndrome. All alcohol, in any form, should be avoided completely. Smoking will cause low birth rate and premature babies.  Street or illegal drugs are very harmful to the baby. They are absolutely forbidden. A baby born to an addicted mother will be addicted at birth. The baby will go through the same withdrawal an adult does. SEEK MEDICAL CARE IF:  You have any concerns or worries during your pregnancy. It is better to call with your questions if you feel they cannot wait, rather than worry about them. SEEK IMMEDIATE  MEDICAL CARE IF:   An unexplained oral temperature above 102 F (38.9 C) develops, or as your caregiver suggests.  You have leaking of fluid from the vagina (birth canal). If leaking membranes are suspected, take your temperature and tell your caregiver of this when you call.  There is vaginal spotting, bleeding, or passing clots. Tell your caregiver of the amount and how many pads are used. Light spotting in pregnancy is common, especially following intercourse.  You develop a bad smelling vaginal discharge with a change in the color from clear to white.  You continue to feel   sick to your stomach (nauseated) and have no relief from remedies suggested. You vomit blood or coffee ground-like materials.  You lose more than 2 pounds of weight or gain more than 2 pounds of weight over 1 week, or as suggested by your caregiver.  You notice swelling of your face, hands, feet, or legs.  You get exposed to German measles and have never had them.  You are exposed to fifth disease or chickenpox.  You develop belly (abdominal) pain. Round ligament discomfort is a common non-cancerous (benign) cause of abdominal pain in pregnancy. Your caregiver still must evaluate you.  You develop a bad headache that does not go away.  You develop fever, diarrhea, pain with urination, or shortness of breath.  You develop visual problems, blurry, or double vision.  You fall or are in a car accident or any kind of trauma.  There is mental or physical violence at home. Document Released: 04/22/2001 Document Revised: 01/21/2012 Document Reviewed: 10/25/2008 [employer] Patient Information 2014 [employer]  

## 2012-11-28 ENCOUNTER — Encounter: Payer: Self-pay | Admitting: Family Medicine

## 2012-12-22 ENCOUNTER — Ambulatory Visit (INDEPENDENT_AMBULATORY_CARE_PROVIDER_SITE_OTHER): Payer: BC Managed Care – PPO | Admitting: Family Medicine

## 2012-12-22 VITALS — BP 132/83 | Temp 99.5°F | Wt 180.0 lb

## 2012-12-22 DIAGNOSIS — Z3403 Encounter for supervision of normal first pregnancy, third trimester: Secondary | ICD-10-CM

## 2012-12-22 DIAGNOSIS — Z3400 Encounter for supervision of normal first pregnancy, unspecified trimester: Secondary | ICD-10-CM

## 2012-12-22 NOTE — Patient Instructions (Signed)
Pregnancy - Second Trimester The second trimester of pregnancy (3 to 6 months) is a period of rapid growth for you and your baby. At the end of the sixth month, your baby is about 9 inches long and weighs 1 1/2 pounds. You will begin to feel the baby move between 18 and 20 weeks of the pregnancy. This is called quickening. Weight gain is faster. A clear fluid (colostrum) may leak out of your breasts. You may feel small contractions of the womb (uterus). This is known as false labor or Braxton-Hicks contractions. This is like a practice for labor when the baby is ready to be born. Usually, the problems with morning sickness have usually passed by the end of your first trimester. Some women develop small dark blotches (called cholasma, mask of pregnancy) on their face that usually goes away after the baby is born. Exposure to the sun makes the blotches worse. Acne may also develop in some pregnant women and pregnant women who have acne, may find that it goes away. PRENATAL EXAMS  Blood work may continue to be done during prenatal exams. These tests are done to check on your health and the probable health of your baby. Blood work is used to follow your blood levels (hemoglobin). Anemia (low hemoglobin) is common during pregnancy. Iron and vitamins are given to help prevent this. You will also be checked for diabetes between 24 and 28 weeks of the pregnancy. Some of the previous blood tests may be repeated.  The size of the uterus is measured during each visit. This is to make sure that the baby is continuing to grow properly according to the dates of the pregnancy.  Your blood pressure is checked every prenatal visit. This is to make sure you are not getting toxemia.  Your urine is checked to make sure you do not have an infection, diabetes or protein in the urine.  Your weight is checked often to make sure gains are happening at the suggested rate. This is to ensure that both you and your baby are  growing normally.  Sometimes, an ultrasound is performed to confirm the proper growth and development of the baby. This is a test which bounces harmless sound waves off the baby so your caregiver can more accurately determine due dates. Sometimes, a test is done on the amniotic fluid surrounding the baby. This test is called an amniocentesis. The amniotic fluid is obtained by sticking a needle into the belly (abdomen). This is done to check the chromosomes in instances where there is a concern about possible genetic problems with the baby. It is also sometimes done near the end of pregnancy if an early delivery is required. In this case, it is done to help make sure the baby's lungs are mature enough for the baby to live outside of the womb. CHANGES OCCURING IN THE SECOND TRIMESTER OF PREGNANCY Your body goes through many changes during pregnancy. They vary from person to person. Talk to your caregiver about changes you notice that you are concerned about.  During the second trimester, you will likely have an increase in your appetite. It is normal to have cravings for certain foods. This varies from person to person and pregnancy to pregnancy.  Your lower abdomen will begin to bulge.  You may have to urinate more often because the uterus and baby are pressing on your bladder. It is also common to get more bladder infections during pregnancy. You can help this by drinking lots of fluids   and emptying your bladder before and after intercourse.  You may begin to get stretch marks on your hips, abdomen, and breasts. These are normal changes in the body during pregnancy. There are no exercises or medicines to take that prevent this change.  You may begin to develop swollen and bulging veins (varicose veins) in your legs. Wearing support hose, elevating your feet for 15 minutes, 3 to 4 times a day and limiting salt in your diet helps lessen the problem.  Heartburn may develop as the uterus grows and  pushes up against the stomach. Antacids recommended by your caregiver helps with this problem. Also, eating smaller meals 4 to 5 times a day helps.  Constipation can be treated with a stool softener or adding bulk to your diet. Drinking lots of fluids, and eating vegetables, fruits, and whole grains are helpful.  Exercising is also helpful. If you have been very active up until your pregnancy, most of these activities can be continued during your pregnancy. If you have been less active, it is helpful to start an exercise program such as walking.  Hemorrhoids may develop at the end of the second trimester. Warm sitz baths and hemorrhoid cream recommended by your caregiver helps hemorrhoid problems.  Backaches may develop during this time of your pregnancy. Avoid heavy lifting, wear low heal shoes, and practice good posture to help with backache problems.  Some pregnant women develop tingling and numbness of their hand and fingers because of swelling and tightening of ligaments in the wrist (carpel tunnel syndrome). This goes away after the baby is born.  As your breasts enlarge, you may have to get a bigger bra. Get a comfortable, cotton, support bra. Do not get a nursing bra until the last month of the pregnancy if you will be nursing the baby.  You may get a dark line from your belly button to the pubic area called the linea nigra.  You may develop rosy cheeks because of increase blood flow to the face.  You may develop spider looking lines of the face, neck, arms, and chest. These go away after the baby is born. HOME CARE INSTRUCTIONS   It is extremely important to avoid all smoking, herbs, alcohol, and unprescribed drugs during your pregnancy. These chemicals affect the formation and growth of the baby. Avoid these chemicals throughout the pregnancy to ensure the delivery of a healthy infant.  Most of your home care instructions are the same as suggested for the first trimester of your  pregnancy. Keep your caregiver's appointments. Follow your caregiver's instructions regarding medicine use, exercise, and diet.  During pregnancy, you are providing food for you and your baby. Continue to eat regular, well-balanced meals. Choose foods such as meat, fish, milk and other low fat dairy products, vegetables, fruits, and whole-grain breads and cereals. Your caregiver will tell you of the ideal weight gain.  A physical sexual relationship may be continued up until near the end of pregnancy if there are no other problems. Problems could include early (premature) leaking of amniotic fluid from the membranes, vaginal bleeding, abdominal pain, or other medical or pregnancy problems.  Exercise regularly if there are no restrictions. Check with your caregiver if you are unsure of the safety of some of your exercises. The greatest weight gain will occur in the last 2 trimesters of pregnancy. Exercise will help you:  Control your weight.  Get you in shape for labor and delivery.  Lose weight after you have the baby.  Wear   a good support or jogging bra for breast tenderness during pregnancy. This may help if worn during sleep. Pads or tissues may be used in the bra if you are leaking colostrum.  Do not use hot tubs, steam rooms or saunas throughout the pregnancy.  Wear your seat belt at all times when driving. This protects you and your baby if you are in an accident.  Avoid raw meat, uncooked cheese, cat litter boxes, and soil used by cats. These carry germs that can cause birth defects in the baby.  The second trimester is also a good time to visit your dentist for your dental health if this has not been done yet. Getting your teeth cleaned is okay. Use a soft toothbrush. Brush gently during pregnancy.  It is easier to leak urine during pregnancy. Tightening up and strengthening the pelvic muscles will help with this problem. Practice stopping your urination while you are going to the  bathroom. These are the same muscles you need to strengthen. It is also the muscles you would use as if you were trying to stop from passing gas. You can practice tightening these muscles up 10 times a set and repeating this about 3 times per day. Once you know what muscles to tighten up, do not perform these exercises during urination. It is more likely to contribute to an infection by backing up the urine.  Ask for help if you have financial, counseling, or nutritional needs during pregnancy. Your caregiver will be able to offer counseling for these needs as well as refer you for other special needs.  Your skin may become oily. If so, wash your face with mild soap, use non-greasy moisturizer and oil or cream based makeup. MEDICINES AND DRUG USE IN PREGNANCY  Take prenatal vitamins as directed. The vitamin should contain 1 milligram of folic acid. Keep all vitamins out of reach of children. Only a couple vitamins or tablets containing iron may be fatal to a baby or young child when ingested.  Avoid use of all medicines, including herbs, over-the-counter medicines, not prescribed or suggested by your caregiver. Only take over-the-counter or prescription medicines for pain, discomfort, or fever as directed by your caregiver. Do not use aspirin.  Let your caregiver also know about herbs you may be using.  Alcohol is related to a number of birth defects. This includes fetal alcohol syndrome. All alcohol, in any form, should be avoided completely. Smoking will cause low birth rate and premature babies.  Street or illegal drugs are very harmful to the baby. They are absolutely forbidden. A baby born to an addicted mother will be addicted at birth. The baby will go through the same withdrawal an adult does. SEEK MEDICAL CARE IF:  You have any concerns or worries during your pregnancy. It is better to call with your questions if you feel they cannot wait, rather than worry about them. SEEK IMMEDIATE  MEDICAL CARE IF:   An unexplained oral temperature above 102 F (38.9 C) develops, or as your caregiver suggests.  You have leaking of fluid from the vagina (birth canal). If leaking membranes are suspected, take your temperature and tell your caregiver of this when you call.  There is vaginal spotting, bleeding, or passing clots. Tell your caregiver of the amount and how many pads are used. Light spotting in pregnancy is common, especially following intercourse.  You develop a bad smelling vaginal discharge with a change in the color from clear to white.  You continue to feel   sick to your stomach (nauseated) and have no relief from remedies suggested. You vomit blood or coffee ground-like materials.  You lose more than 2 pounds of weight or gain more than 2 pounds of weight over 1 week, or as suggested by your caregiver.  You notice swelling of your face, hands, feet, or legs.  You get exposed to German measles and have never had them.  You are exposed to fifth disease or chickenpox.  You develop belly (abdominal) pain. Round ligament discomfort is a common non-cancerous (benign) cause of abdominal pain in pregnancy. Your caregiver still must evaluate you.  You develop a bad headache that does not go away.  You develop fever, diarrhea, pain with urination, or shortness of breath.  You develop visual problems, blurry, or double vision.  You fall or are in a car accident or any kind of trauma.  There is mental or physical violence at home. Document Released: 04/22/2001 Document Revised: 01/21/2012 Document Reviewed: 10/25/2008 [employer] Patient Information 2014 [employer]  

## 2012-12-22 NOTE — Progress Notes (Signed)
Anna Sexton is 22 y.o. G1P0 @ [redacted]w[redacted]d present for her routine prenatal appointment. She is feeling a little tired today compared to usual. Otherwise she believes she has felt the baby move for the first time. She has a mild headache.   O: BP 132/83  Temp(Src) 99.5 F (37.5 C)  Wt 180 lb (81.647 kg)  BMI 30.88 kg/m2  LMP 08/16/2012 Abd: gravid (2 cn below umbilicus)   A/P: - Desires breast feeding - B positive blood type - Looking into child birthing classes - Anatomy scan ordered today - Discussed bleeding, leakage of fluid, PTL and red flags for HYN/headache abdominal pain to be seen in [facility]. - OB clinic 4 weeks

## 2013-01-03 ENCOUNTER — Ambulatory Visit (HOSPITAL_COMMUNITY): Payer: Self-pay

## 2013-01-04 ENCOUNTER — Ambulatory Visit (HOSPITAL_COMMUNITY)
Admission: RE | Admit: 2013-01-04 | Discharge: 2013-01-04 | Disposition: A | Discharge status: Home / Self Care | Payer: Medicaid Other | Source: Ambulatory Visit | Attending: Family Medicine | Admitting: Family Medicine

## 2013-01-04 DIAGNOSIS — Z3403 Encounter for supervision of normal first pregnancy, third trimester: Secondary | ICD-10-CM

## 2013-01-04 DIAGNOSIS — Z3689 Encounter for other specified antenatal screening: Secondary | ICD-10-CM | POA: Insufficient documentation

## 2013-01-04 DIAGNOSIS — O352XX0 Maternal care for (suspected) hereditary disease in fetus, not applicable or unspecified: Secondary | ICD-10-CM | POA: Insufficient documentation

## 2013-02-03 ENCOUNTER — Ambulatory Visit (INDEPENDENT_AMBULATORY_CARE_PROVIDER_SITE_OTHER): Payer: 59 | Admitting: Family Medicine

## 2013-02-03 VITALS — BP 128/76 | Temp 98.3°F | Wt 184.0 lb

## 2013-02-03 DIAGNOSIS — Z23 Encounter for immunization: Secondary | ICD-10-CM

## 2013-02-03 DIAGNOSIS — Z3480 Encounter for supervision of other normal pregnancy, unspecified trimester: Secondary | ICD-10-CM

## 2013-02-03 DIAGNOSIS — R350 Frequency of micturition: Secondary | ICD-10-CM

## 2013-02-03 DIAGNOSIS — Z3492 Encounter for supervision of normal pregnancy, unspecified, second trimester: Secondary | ICD-10-CM

## 2013-02-03 LAB — POCT URINALYSIS DIPSTICK
Bilirubin, UA: NEGATIVE
Ketones, UA: NEGATIVE
Spec Grav, UA: 1.02

## 2013-02-03 LAB — POCT UA - MICROSCOPIC ONLY

## 2013-02-03 NOTE — Patient Instructions (Addendum)
It was nice seeing you today Anna Sexton, and I am glad you and the baby are doing fine. Today we would give you your flu shot, we also need you to have a repeat U/S to check your baby's heart. Continue fetal kick count. Call if any concern with the pregnancy or go to the [facility]. Continue prenatal vitamin and follow up with your PCP in 4wks.  Fetal Movement Counts Patient Name: __________________________________________________ Patient Due Date: ____________________ Performing a fetal movement count is highly recommended in high-risk pregnancies, but it is good for every pregnant woman to do. Your caregiver may ask you to start counting fetal movements at 28 weeks of the pregnancy. Fetal movements often increase:  After eating a full meal.  After physical activity.  After eating or drinking something sweet or cold.  At rest. Pay attention to when you feel the baby is most active. This will help you notice a pattern of your baby's sleep and wake cycles and what factors contribute to an increase in fetal movement. It is important to perform a fetal movement count at the same time each day when your baby is normally most active.  HOW TO COUNT FETAL MOVEMENTS 1. Find a quiet and comfortable area to sit or lie down on your left side. Lying on your left side provides the best blood and oxygen circulation to your baby. 2. Write down the day and time on a sheet of paper or in a journal. 3. Start counting kicks, flutters, swishes, rolls, or jabs in a 2 hour period. You should feel at least 10 movements within 2 hours. 4. If you do not feel 10 movements in 2 hours, wait 2 3 hours and count again. Look for a change in the pattern or not enough counts in 2 hours. SEEK MEDICAL CARE IF:  You feel less than 10 counts in 2 hours, tried twice.  There is no movement in over an hour.  The pattern is changing or taking longer each day to reach 10 counts in 2 hours.  You feel the baby is not moving as  he or she usually does. Date: ____________ Movements: ____________ Start time: ____________ Anna Sexton time: ____________  Date: ____________ Movements: ____________ Start time: ____________ Anna Sexton time: ____________ Date: ____________ Movements: ____________ Start time: ____________ Anna Sexton time: ____________ Date: ____________ Movements: ____________ Start time: ____________ Anna Sexton time: ____________ Date: ____________ Movements: ____________ Start time: ____________ Anna Sexton time: ____________ Date: ____________ Movements: ____________ Start time: ____________ Anna Sexton time: ____________ Date: ____________ Movements: ____________ Start time: ____________ Anna Sexton time: ____________ Date: ____________ Movements: ____________ Start time: ____________ Anna Sexton time: ____________  Date: ____________ Movements: ____________ Start time: ____________ Anna Sexton time: ____________ Date: ____________ Movements: ____________ Start time: ____________ Anna Sexton time: ____________ Date: ____________ Movements: ____________ Start time: ____________ Anna Sexton time: ____________ Date: ____________ Movements: ____________ Start time: ____________ Anna Sexton time: ____________ Date: ____________ Movements: ____________ Start time: ____________ Anna Sexton time: ____________ Date: ____________ Movements: ____________ Start time: ____________ Anna Sexton time: ____________ Date: ____________ Movements: ____________ Start time: ____________ Anna Sexton time: ____________  Date: ____________ Movements: ____________ Start time: ____________ Anna Sexton time: ____________ Date: ____________ Movements: ____________ Start time: ____________ Anna Sexton time: ____________ Date: ____________ Movements: ____________ Start time: ____________ Anna Sexton time: ____________ Date: ____________ Movements: ____________ Start time: ____________ Anna Sexton time: ____________ Date: ____________ Movements: ____________ Start time: ____________ Anna Sexton time: ____________ Date: ____________  Movements: ____________ Start time: ____________ Anna Sexton time: ____________ Date: ____________ Movements: ____________ Start time: ____________ Anna Sexton time: ____________  Date: ____________ Movements: ____________ Start time: ____________ Anna Sexton time:  ____________ Date: ____________ Movements: ____________ Start time: ____________ Anna Sexton time: ____________ Date: ____________ Movements: ____________ Start time: ____________ Anna Sexton time: ____________ Date: ____________ Movements: ____________ Start time: ____________ Anna Sexton time: ____________ Date: ____________ Movements: ____________ Start time: ____________ Anna Sexton time: ____________ Date: ____________ Movements: ____________ Start time: ____________ Anna Sexton time: ____________ Date: ____________ Movements: ____________ Start time: ____________ Anna Sexton time: ____________  Date: ____________ Movements: ____________ Start time: ____________ Anna Sexton time: ____________ Date: ____________ Movements: ____________ Start time: ____________ Anna Sexton time: ____________ Date: ____________ Movements: ____________ Start time: ____________ Anna Sexton time: ____________ Date: ____________ Movements: ____________ Start time: ____________ Anna Sexton time: ____________ Date: ____________ Movements: ____________ Start time: ____________ Anna Sexton time: ____________ Date: ____________ Movements: ____________ Start time: ____________ Anna Sexton time: ____________ Date: ____________ Movements: ____________ Start time: ____________ Anna Sexton time: ____________  Date: ____________ Movements: ____________ Start time: ____________ Anna Sexton time: ____________ Date: ____________ Movements: ____________ Start time: ____________ Anna Sexton time: ____________ Date: ____________ Movements: ____________ Start time: ____________ Anna Sexton time: ____________ Date: ____________ Movements: ____________ Start time: ____________ Anna Sexton time: ____________ Date: ____________ Movements: ____________ Start time:  ____________ Anna Sexton time: ____________ Date: ____________ Movements: ____________ Start time: ____________ Anna Sexton time: ____________ Date: ____________ Movements: ____________ Start time: ____________ Anna Sexton time: ____________  Date: ____________ Movements: ____________ Start time: ____________ Anna Sexton time: ____________ Date: ____________ Movements: ____________ Start time: ____________ Anna Sexton time: ____________ Date: ____________ Movements: ____________ Start time: ____________ Anna Sexton time: ____________ Date: ____________ Movements: ____________ Start time: ____________ Anna Sexton time: ____________ Date: ____________ Movements: ____________ Start time: ____________ Anna Sexton time: ____________ Date: ____________ Movements: ____________ Start time: ____________ Anna Sexton time: ____________ Date: ____________ Movements: ____________ Start time: ____________ Anna Sexton time: ____________  Date: ____________ Movements: ____________ Start time: ____________ Anna Sexton time: ____________ Date: ____________ Movements: ____________ Start time: ____________ Anna Sexton time: ____________ Date: ____________ Movements: ____________ Start time: ____________ Anna Sexton time: ____________ Date: ____________ Movements: ____________ Start time: ____________ Anna Sexton time: ____________ Date: ____________ Movements: ____________ Start time: ____________ Anna Sexton time: ____________ Date: ____________ Movements: ____________ Start time: ____________ Anna Sexton time: ____________ Document Released: 05/28/2006 Document Revised: 04/14/2012 Document Reviewed: 02/23/2012 [employer] Patient Information 2014 [employer]

## 2013-02-03 NOTE — Progress Notes (Signed)
22 y/O G1P0 at 81w3day here for routine prenatal care,only concern is excessive urination,she denies blood in her [place] Na has regular thin vaginal discharge,no odor to her discharge,discharge is clear.Denies uterine contraction,no vaginal bleeding.Patient has been feeling the baby move more often in the last few weeks,she is excited about this.  Exam: Gen: Not in distress. Resp: Air entry equal B/L,no wheezing. Heart: S1 S2 no murmurs. Abd: Gravid,no tenderness. FHR 147 bpm, FH 24cm. Fetus is vertex presentation confirmed with bedside U/S. Heart beat seen on bedside fetal U/S. Ext: No edema.  A/P: 22 Y/O G1P0 at [redacted]w[redacted]d with normal pregnancy progression.         Concern for excessive urination. This might be normal in pregnancy, but UA obtained to r/o infection.        Flu shot given today. Tdap recommended in 4wks.        I reviewed her labs which were normal except for positive sickle cell screening,electrophoresis negative.        Anatomic U/S reviewed. Right ventricular outflow tract not seen due to position,recommend repeat OB U/S.        U/S scheduled today.        Fetal kick count discussed,preterm labor precaution discussed.        F/U in 4wks with PCP.  Result: UA Trace leukocyte,neg Nitrite, Epithelial cells 10-15,Bacteria 2+,RBC 0-3.              This looks like a dirty catch urine with lots of epithelial cells.              Plan to culture urine anyway. To start on A/B if bacteria colony.

## 2013-02-04 LAB — CULTURE, OB URINE: Colony Count: NO GROWTH

## 2013-02-07 ENCOUNTER — Telehealth: Payer: Self-pay | Admitting: Family Medicine

## 2013-02-07 NOTE — Telephone Encounter (Signed)
Pt came in stating that she needs a copy of her  flu shot.

## 2013-02-07 NOTE — Telephone Encounter (Signed)
Urine culture report discussed with patient,advised to follow up as planned for routine prenatal care.

## 2013-02-09 ENCOUNTER — Ambulatory Visit (HOSPITAL_COMMUNITY)
Admission: RE | Admit: 2013-02-09 | Discharge: 2013-02-09 | Disposition: A | Discharge status: Home / Self Care | Payer: Medicaid Other | Source: Ambulatory Visit | Attending: Family Medicine | Admitting: Family Medicine

## 2013-02-09 ENCOUNTER — Other Ambulatory Visit: Payer: Self-pay | Admitting: Family Medicine

## 2013-02-09 ENCOUNTER — Ambulatory Visit (HOSPITAL_COMMUNITY): Admission: RE | Admit: 2013-02-09 | Payer: Medicaid Other | Source: Ambulatory Visit

## 2013-02-09 DIAGNOSIS — O352XX0 Maternal care for (suspected) hereditary disease in fetus, not applicable or unspecified: Secondary | ICD-10-CM | POA: Insufficient documentation

## 2013-02-09 DIAGNOSIS — Z3492 Encounter for supervision of normal pregnancy, unspecified, second trimester: Secondary | ICD-10-CM

## 2013-02-09 DIAGNOSIS — Z3689 Encounter for other specified antenatal screening: Secondary | ICD-10-CM | POA: Insufficient documentation

## 2013-02-21 ENCOUNTER — Encounter: Payer: Self-pay | Admitting: Family Medicine

## 2013-02-24 ENCOUNTER — Encounter (HOSPITAL_COMMUNITY): Payer: Self-pay

## 2013-02-24 ENCOUNTER — Inpatient Hospital Stay (HOSPITAL_COMMUNITY)
Admission: AD | Admit: 2013-02-24 | Discharge: 2013-02-24 | Disposition: A | Discharge status: Home / Self Care | Payer: Medicaid Other | Source: Ambulatory Visit | Attending: Obstetrics & Gynecology | Admitting: Obstetrics & Gynecology

## 2013-02-24 DIAGNOSIS — M549 Dorsalgia, unspecified: Secondary | ICD-10-CM | POA: Insufficient documentation

## 2013-02-24 DIAGNOSIS — K529 Noninfective gastroenteritis and colitis, unspecified: Secondary | ICD-10-CM

## 2013-02-24 DIAGNOSIS — R109 Unspecified abdominal pain: Secondary | ICD-10-CM | POA: Insufficient documentation

## 2013-02-24 DIAGNOSIS — O212 Late vomiting of pregnancy: Secondary | ICD-10-CM | POA: Insufficient documentation

## 2013-02-24 DIAGNOSIS — O99891 Other specified diseases and conditions complicating pregnancy: Secondary | ICD-10-CM | POA: Insufficient documentation

## 2013-02-24 LAB — URINALYSIS, ROUTINE W REFLEX MICROSCOPIC
Bilirubin Urine: NEGATIVE
Glucose, UA: NEGATIVE mg/dL
Nitrite: NEGATIVE
Specific Gravity, Urine: 1.02 (ref 1.005–1.030)
Urobilinogen, UA: 0.2 mg/dL (ref 0.0–1.0)

## 2013-02-24 MED ORDER — METOCLOPRAMIDE HCL 10 MG PO TABS
5.0000 mg | ORAL_TABLET | Freq: Once | ORAL | Status: AC
  Administered 2013-02-24: 5 mg via ORAL
  Filled 2013-02-24: qty 1

## 2013-02-24 NOTE — MAU Provider Note (Signed)
History     CSN: [id number]  Arrival date and time: 02/24/13 1002   None     Chief Complaint  Patient presents with  . Emesis  . Back Pain  . Abdominal Cramping   Emesis  Associated symptoms include abdominal pain. Pertinent negatives include no chest pain, chills, coughing, diarrhea, dizziness, fever, headaches or myalgias.  Back Pain Associated symptoms include abdominal pain. Pertinent negatives include no chest pain, dysuria, fever or headaches.  Abdominal Cramping Associated symptoms include nausea and vomiting. Pertinent negatives include no diarrhea, dysuria, fever, frequency, headaches, melena or myalgias.   Anna Sexton is a 22y.o G1P0 at 27w3 who presents with abdominal cramping and emesis X1 day. Per pt was feeling in normal state of health until earlier this am when she had X2 episodes of NBNB accompanied by some suprapubic discomfort. No recent undercooked food as far as she is aware. No diarrhea. No fevers. Denies hematuria/dysuria/vaginal pruritis or foul odor.  PNC at [facility] since the 1st trimester. Has appointment there at 26 weeks with Dr. Claiborne Billings.   OB History   Grav Para Term Preterm Abortions TAB SAB Ect Mult Living   1               Past Medical History  Diagnosis Date  . ATRIAL SEPTAL DEFECT, HX OF 06/11/2009  . CHLAMYDIAL INFECTION 05/28/2010  . THYROID NODULE 06/11/2009  . Urinary frequency 05/24/2010  . Heart murmur     ASD repair at 22 years of age    Past Surgical History  Procedure Laterality Date  . Asd repair      2008  . Cardiac catheterization  10/2007    for ASD repair    Family History  Problem Relation Age of Onset  . Diabetes insipidus Father   . Diabetes Father   . Heart disease Father   . Hypertension Father   . Hyperlipidemia Father   . Stroke Father   . Arthritis Mother   . Depression Maternal Grandmother   . Hearing loss Maternal Grandmother     History  Substance Use Topics  . Smoking status: Never Smoker   .  Smokeless tobacco: Never Used  . Alcohol Use: No    Allergies: No Known Allergies  Prescriptions prior to admission  Medication Sig Dispense Refill  . acetaminophen (TYLENOL) 325 MG tablet Take 650 mg by mouth every 6 (six) hours as needed for pain.      . Prenatal Vit-Fe Fumarate-FA (PRENATAL MULTIVITAMIN) TABS Take 1 tablet by mouth daily at 12 noon.        Review of Systems  Constitutional: Negative for fever and chills.  HENT: Negative for sore throat.   Eyes: Negative for blurred vision.  Respiratory: Negative for cough and shortness of breath.   Cardiovascular: Negative for chest pain and palpitations.  Gastrointestinal: Positive for nausea, vomiting and abdominal pain. Negative for heartburn, diarrhea and melena.  Genitourinary: Negative for dysuria, urgency and frequency.  Musculoskeletal: Positive for back pain. Negative for myalgias.  Skin: Negative for rash.  Neurological: Negative for dizziness, seizures, loss of consciousness and headaches.  Endo/Heme/Allergies: Does not bruise/bleed easily.  Psychiatric/Behavioral: Negative for depression.   Physical Exam   Blood pressure 113/72, pulse 80, temperature 98.5 F (36.9 C), temperature source Oral, resp. rate 18, height 5\' 3"  (1.6 m), weight 86.637 kg (191 lb), last menstrual period 08/16/2012.  Physical Exam  Constitutional: She is oriented to person, place, and time. She appears well-developed and well-nourished. No distress.  HENT:  Head: Normocephalic and atraumatic.  Eyes: EOM are normal.  Neck: Neck supple.  Cardiovascular: Normal rate.   Respiratory: Effort normal and breath sounds normal.  GI: Soft. Bowel sounds are normal. She exhibits no distension. There is no tenderness.  Musculoskeletal: Normal range of motion. She exhibits no edema.  Neurological: She is alert and oriented to person, place, and time.  Skin: Skin is warm and dry. No erythema.  Psychiatric: She has a normal mood and affect. Her behavior  is normal. Judgment and thought content normal.    MAU Course  Procedures  MDM U/A FHR- 155, mod var, post acc, no decel  Assessment and Plan  Pt is a 22 y.o at 27w3 who presents with abd cramping and self resolved emesis  1. Emesis and abd cramping- most likely 2/2 gastroenteritis vs food poisoning vs UTI vs STI. Does not appear ill or dehydrated, good cap refill < 3, vs stable, apprp skin turgor. Had some PO here and an additional episode of emesis. Given reglan X1.  -self resolved, pt not desiring medications for treatment -given info on brat diet -expect continued improvement  2. FHR- reassuring -cat I -has prenatal visit at [facility] on 10/26, can follow up there for routine care  Anselm Lis 02/24/2013, 11:15 AM   I agree with resident's note and plan of care. Pt left before I was able to evaluate patient's status. I agree with Dr. Nyra Capes note, and pt should return for worsening symptoms or inability to tolerate PO. As pt was not seen by myself, I did not submit billing for this visit.   Tawana Scale, MD OB Fellow 02/24/2013 10:36 PM

## 2013-02-24 NOTE — Progress Notes (Signed)
Awaiting a callback from Dr. Michail Jewels

## 2013-02-24 NOTE — MAU Note (Signed)
Been having back pain, last 2 days off and on.  After eating breakfast today, threw up, now having abd pain, not as bad as initially.

## 2013-03-01 NOTE — MAU Provider Note (Signed)
Attestation of Attending Supervision of Obstetric Fellow: Evaluation and management procedures were performed by the Obstetric Fellow under my supervision and collaboration.  I have reviewed the Obstetric Fellow's note and chart, and I agree with the management and plan.  UGONNA  ANYANWU, MD, FACOG Attending Obstetrician & Gynecologist Faculty Practice, [facility]   

## 2013-03-05 ENCOUNTER — Encounter (HOSPITAL_COMMUNITY): Payer: Self-pay

## 2013-03-05 ENCOUNTER — Inpatient Hospital Stay (HOSPITAL_COMMUNITY)
Admission: AD | Admit: 2013-03-05 | Discharge: 2013-03-05 | Disposition: A | Discharge status: Home / Self Care | Payer: 59 | Attending: Obstetrics & Gynecology | Admitting: Obstetrics & Gynecology

## 2013-03-05 DIAGNOSIS — R079 Chest pain, unspecified: Secondary | ICD-10-CM | POA: Insufficient documentation

## 2013-03-05 DIAGNOSIS — O99891 Other specified diseases and conditions complicating pregnancy: Secondary | ICD-10-CM | POA: Insufficient documentation

## 2013-03-05 DIAGNOSIS — R0602 Shortness of breath: Secondary | ICD-10-CM | POA: Insufficient documentation

## 2013-03-05 DIAGNOSIS — R071 Chest pain on breathing: Secondary | ICD-10-CM

## 2013-03-05 DIAGNOSIS — R0781 Pleurodynia: Secondary | ICD-10-CM

## 2013-03-05 LAB — URINALYSIS, ROUTINE W REFLEX MICROSCOPIC
Hgb urine dipstick: NEGATIVE
Protein, ur: NEGATIVE mg/dL
Urobilinogen, UA: 0.2 mg/dL (ref 0.0–1.0)

## 2013-03-05 LAB — CBC
MCH: 28.9 pg (ref 26.0–34.0)
MCHC: 33.3 g/dL (ref 30.0–36.0)
MCV: 86.7 fL (ref 78.0–100.0)
Platelets: 224 10*3/uL (ref 150–400)
RDW: 13.1 % (ref 11.5–15.5)
WBC: 11.1 10*3/uL — ABNORMAL HIGH (ref 4.0–10.5)

## 2013-03-05 MED ORDER — IBUPROFEN 600 MG PO TABS: 600.0000 mg | ORAL_TABLET | Freq: Four times a day (QID) | ORAL | Status: DC | PRN

## 2013-03-05 MED ORDER — ACETAMINOPHEN 500 MG PO TABS
1000.0000 mg | ORAL_TABLET | Freq: Once | ORAL | Status: AC
  Administered 2013-03-05: 1000 mg via ORAL
  Filled 2013-03-05: qty 2

## 2013-03-05 NOTE — Progress Notes (Signed)
Notified pt in MAU for c/o CP, pain with inspiration and movement, better when at rest, provider will come see pt in MAU shortly

## 2013-03-05 NOTE — MAU Note (Signed)
Pt thinks she may have pleurisy. Pain worse when moving/walking. Not experiencing pain with sitting.

## 2013-03-05 NOTE — MAU Note (Signed)
Pt presents with complaints of having chest pain and shortness of breath that started 2 days ago. She describes it as having pain under her left breast radiating into her side. Pt states it hurts to breath in and out and take a deep breath.

## 2013-03-05 NOTE — MAU Provider Note (Signed)
Chief Complaint:  Shortness of Breath and Chest Pain   Anna Sexton is a 22 y.o.  G1P0 with IUP at [redacted]w[redacted]d presenting for Shortness of Breath and Chest Pain  Pt complains of left sided chest pain x 2 days. She reports onset of symptoms 2 days ago with worsening last night and slight improvement today. She describes the pain as sharp in quality and currently 6/10 in severity. It is located under her left lower breast and sometimes radiates to her left shoulder blade. Worsening factors include deep inspiration, movement and laying on her left side. Alleviating factors include taking shallow breaths and sitting still. She states this feels like an episode of pleurisy she had her freshman year of college. During that episode she took NSAIDs but is unsure what to take now due to her pregnancy. Of note pt also reports a history of congenital ASD s/p repair 5 years ago. She reports the repair was successful and she has had no cardiac issues since that time. She had a normal echocardiogram and normal stress test in 2012. She has not seen her cardiologist in 1-2 years. She states she will be schedule for a fetal echocardiogram in the near future.   She denies nausea, vomiting, diaphoresis, headache, vision changes, tachypnea, dizziness, palpitations, abdominal pain or lower extremity swelling. She denies contractions, vaginal bleeding or LOF. She reports normal FM.   She has been receiving her prenatal care at [facility] since [redacted] weeks gestation.   Menstrual History: OB History   Grav Para Term Preterm Abortions TAB SAB Ect Mult Living   1               Patient's last menstrual period was 08/16/2012.      Past Medical History  Diagnosis Date  . ATRIAL SEPTAL DEFECT, HX OF 06/11/2009  . CHLAMYDIAL INFECTION 05/28/2010  . THYROID NODULE 06/11/2009  . Urinary frequency 05/24/2010  . Heart murmur     ASD repair at 22 years of age    Past Surgical History  Procedure Laterality Date  . Asd repair     2008  . Cardiac catheterization  10/2007    for ASD repair    Family History  Problem Relation Age of Onset  . Diabetes insipidus Father   . Diabetes Father   . Heart disease Father   . Hypertension Father   . Hyperlipidemia Father   . Stroke Father   . Arthritis Mother   . Depression Maternal Grandmother   . Hearing loss Maternal Grandmother     History  Substance Use Topics  . Smoking status: Never Smoker   . Smokeless tobacco: Never Used  . Alcohol Use: No     No Known Allergies  Prescriptions prior to admission  Medication Sig Dispense Refill  . acetaminophen (TYLENOL) 325 MG tablet Take 650 mg by mouth every 6 (six) hours as needed for pain.      . Prenatal Vit-Fe Fumarate-FA (PRENATAL MULTIVITAMIN) TABS Take 1 tablet by mouth daily at 12 noon.        Review of Systems - Negative except for what is mentioned in HPI.  Physical Exam  Blood pressure 146/75, pulse 72, temperature 97.6 F (36.4 C), temperature source Oral, resp. rate 18, height 5\' 3"  (1.6 m), weight 87.091 kg (192 lb), last menstrual period 08/16/2012, SpO2 100.00%. GENERAL: Well-developed, well-nourished female in no acute distress. Pt appears comfortable resting in bed.  LUNGS: Clear to auscultation bilaterally.  HEART: Regular rate and rhythm. 2/6  SEM best heard over LUSB. No rub or gallop.  ABDOMEN: Soft, nontender, nondistended, gravid.  EXTREMITIES: Nontender, no edema, 2+ distal pulses. FHT:  Baseline rate 150 bpm   Variability moderate  Accelerations present   Decelerations occasional variable Contractions: none   Labs: Results for orders placed during the hospital encounter of 03/05/13 (from the past 24 hour(s))  URINALYSIS, ROUTINE W REFLEX MICROSCOPIC   Collection Time    03/05/13 10:35 AM      Result Value Range   Color, Urine YELLOW  YELLOW   APPearance CLEAR  CLEAR   Specific Gravity, Urine 1.025  1.005 - 1.030   pH 7.0  5.0 - 8.0   Glucose, UA 100 (*) NEGATIVE mg/dL   Hgb urine  dipstick NEGATIVE  NEGATIVE   Bilirubin Urine NEGATIVE  NEGATIVE   Ketones, ur NEGATIVE  NEGATIVE mg/dL   Protein, ur NEGATIVE  NEGATIVE mg/dL   Urobilinogen, UA 0.2  0.0 - 1.0 mg/dL   Nitrite NEGATIVE  NEGATIVE   Leukocytes, UA NEGATIVE  NEGATIVE  CBC   Collection Time    03/05/13 12:30 PM      Result Value Range   WBC 11.1 (*) 4.0 - 10.5 K/uL   RBC 3.84 (*) 3.87 - 5.11 MIL/uL   Hemoglobin 11.1 (*) 12.0 - 15.0 g/dL   HCT 16.1 (*) 09.6 - 04.5 %   MCV 86.7  78.0 - 100.0 fL   MCH 28.9  26.0 - 34.0 pg   MCHC 33.3  30.0 - 36.0 g/dL   RDW 40.9  81.1 - 91.4 %   Platelets 224  150 - 400 K/uL  TROPONIN I   Collection Time    03/05/13 12:30 PM      Result Value Range   Troponin I <0.30  <0.30 ng/mL   <ECG> HR: 73 PR interval: 150 QTc: 423 Interpretation: NSR. No ST changes.   Imaging Studies:  US Ob Follow Up  02/09/2013   OBSTETRICAL ULTRASOUND: This exam was performed within a [facility]. The OB US report was generated in the [facility] system, and faxed to the ordering physician.   This report is also available in [employer] and in the [employer]. See [facility] Obstetric US report.   Assessment: Anna Sexton is  22 y.o. G1P0 at [redacted]w[redacted]d presents with Shortness of Breath and Chest Pain  ECG and Troponin normal. CBC with no concerning abnormalities. O2 saturation and heart rate are normal. No evidence of cardiac ischemia, heart failure, PE or pericarditis. Pain consistent with pleuritic type chest pain and pt reports this feels just like a prior episode of pleurisy she experienced a few years ago.   Plan: - Given that she is <[redacted] weeks gestation recommend short course of Ibuprofen 600 mg q6 PRN x 5 days only. Instructed pt not to take this medication or any other NSAID after [redacted] weeks gestation. 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  - She has routine prenatal follow up visit in 6 days which she is  encouraged to keep.  - Advised her to return if she develops worsening symptoms or any new symptom of concern.   Pt seen and examined with Joellyn Haff, CNM  [place] 10/25/201412:30 PM

## 2013-03-06 NOTE — MAU Provider Note (Addendum)
I spoke with and examined patient and agree with resident's note and plan of care.  We discussed pt presentation/complaints w/ Dr. Marice Potter, who also saw/spoke w/ pt in MAU, and stated it was OK to d/c.  Cheral Marker, CNM, [facility] 03/06/2013 6:29 AM

## 2013-03-07 ENCOUNTER — Encounter: Payer: Medicaid Other | Admitting: Family Medicine

## 2013-03-11 ENCOUNTER — Ambulatory Visit (INDEPENDENT_AMBULATORY_CARE_PROVIDER_SITE_OTHER): Payer: Medicaid Other | Admitting: Family Medicine

## 2013-03-11 VITALS — BP 122/61 | Temp 99.1°F | Wt 192.0 lb

## 2013-03-11 DIAGNOSIS — Z3400 Encounter for supervision of normal first pregnancy, unspecified trimester: Secondary | ICD-10-CM

## 2013-03-11 NOTE — Patient Instructions (Signed)

## 2013-03-13 ENCOUNTER — Telehealth: Payer: Self-pay | Admitting: Family Medicine

## 2013-03-13 DIAGNOSIS — Z8249 Family history of ischemic heart disease and other diseases of the circulatory system: Secondary | ICD-10-CM

## 2013-03-13 NOTE — Telephone Encounter (Signed)
Please call peds cardiology, and ask what echo they would like to have ordered for this patient. There are many to choose from and I am not certain which is correct. Patient had a ASD at birth and [place] with this pregnancy, suggested by MFM, to have fetal echo completed. I will order a 2D echo with contrast, although this will need to be double checked for appropriate type and we can change it if necessary. Thanks.

## 2013-03-13 NOTE — Progress Notes (Signed)
Anna Sexton G1P0 is present for her routine OB appointment at [redacted]w[redacted]d. She reports no leaking, GOF, VB or contractions. She does feel her baby move well.  O: BP 122/61  Temp(Src) 99.1 F (37.3 C)  Wt 192 lb (87.091 kg)  LMP 08/16/2012 Abd: gravid  A/P:  Patient will need TDAP at next appointment.  GTT ordered this visit for ASAP  [place] results recommend fetal echocardiogram with peds cardiology for family history. Will have staff call MFM/peds cardiology to schedule appropriate echo.  FHR: 145, with audible accelerations.  Mild anemia, advised to take supplemental Iron.  Discussed PTL and fetal kick counts.  F/U: 2 weeks

## 2013-03-14 ENCOUNTER — Telehealth: Payer: Self-pay | Admitting: Family Medicine

## 2013-03-14 NOTE — Telephone Encounter (Signed)
Dr Isabella Bowens team asked me to help with this because they were confused---I am also uncertain what you are asking them to do. Does the PATIENT need an ECHo or the fetus needs an ECHO.  You should probably call MFM and ask THEN what you need---I doubt your nurse has the knowledge base to figure this out---I certainly don't. Denny Levy

## 2013-03-15 NOTE — Telephone Encounter (Signed)
I called MFM, they stated our office will need to call [facility] at [phone], choose option #1 and ask for first available appointment to have a fetal echo completed. They will need demographics of pregnant mother and past ultrasound reports faxed to them (they are not on epic). Patient is currently >[redacted] weeks gestation and has a family history of ASD.   The patient will then need called and given date/time of appointment and contact information below:  [facility] [address] [place], [place]  Thanks.

## 2013-03-16 ENCOUNTER — Other Ambulatory Visit: Payer: Self-pay | Admitting: Family Medicine

## 2013-03-16 ENCOUNTER — Other Ambulatory Visit (INDEPENDENT_AMBULATORY_CARE_PROVIDER_SITE_OTHER): Payer: 59

## 2013-03-16 DIAGNOSIS — Z3403 Encounter for supervision of normal first pregnancy, third trimester: Secondary | ICD-10-CM

## 2013-03-16 DIAGNOSIS — Z3400 Encounter for supervision of normal first pregnancy, unspecified trimester: Secondary | ICD-10-CM

## 2013-03-16 LAB — GLUCOSE, CAPILLARY: Comment 1: 1

## 2013-03-16 NOTE — Progress Notes (Signed)
Drew 28 wk labs (did not repeat CBC - done 03-05-13)   1 hr OB glucose = 77 mg/dL   BAJORDAN, MLS

## 2013-03-17 NOTE — Telephone Encounter (Signed)
Left message with [facility] to return my call. Anna Sexton, Virgel Bouquet

## 2013-03-25 ENCOUNTER — Ambulatory Visit (INDEPENDENT_AMBULATORY_CARE_PROVIDER_SITE_OTHER): Payer: 59 | Admitting: Family Medicine

## 2013-03-25 VITALS — BP 129/62 | Temp 99.4°F | Wt 194.0 lb

## 2013-03-25 DIAGNOSIS — Z23 Encounter for immunization: Secondary | ICD-10-CM

## 2013-03-25 MED ORDER — FERROUS FUMARATE 324 MG PO TABS: 1.0000 | ORAL_TABLET | Freq: Every day | ORAL | Status: DC

## 2013-03-25 NOTE — Patient Instructions (Signed)
It has been a pleasure to see you today. Please take the medications as prescribed. Continuing counting baby's kicks Make your next appointment in 2 weeks.

## 2013-03-25 NOTE — Progress Notes (Signed)
Anna Sexton is a 22 y.o. G1P0 at 104w4d for routine follow up.  She reports no complaints.   See flow sheet for details.  A/P: Pregnancy at [redacted]w[redacted]d.  Doing well.    Labs reviewed and pt was informed of results. GTT 1h wnl. RPR and HIV negative/nonreative. Mild anemia. Recommended iron supplementation   Pt went yesterday to [facility] and ECHO was done. Pt reports she was informed that everything was ok and they will f/u baby after birth if needed.  Tdapwas given today.  Preterm labor precautions reviewed. Kick counts reviewed. Follow up 2 weeks.

## 2013-04-11 ENCOUNTER — Encounter: Payer: Medicaid Other | Admitting: Family Medicine

## 2013-04-13 ENCOUNTER — Ambulatory Visit (INDEPENDENT_AMBULATORY_CARE_PROVIDER_SITE_OTHER): Payer: Medicaid Other | Admitting: Family Medicine

## 2013-04-13 VITALS — BP 121/67 | Temp 98.0°F | Wt 192.0 lb

## 2013-04-13 DIAGNOSIS — Z331 Pregnant state, incidental: Secondary | ICD-10-CM

## 2013-04-13 DIAGNOSIS — Z3493 Encounter for supervision of normal pregnancy, unspecified, third trimester: Secondary | ICD-10-CM

## 2013-04-13 NOTE — Progress Notes (Signed)
Patient here today for her routine prenatal visit. G1P0 @ [redacted]w[redacted]d. Patient states she gets occasional dizziness upon standing she has been lately with increased vomiting. She has been starting to feel better over this past week, however she still is unable to tolerate breakfast. No leaking, gush of fluid, or vaginal bleeding. No regular contractions.  O: BP 121/67  Temp(Src) 98 F (36.7 C)  Wt 192 lb (87.091 kg)  LMP 08/16/2012 Abd: gravid  A/P:  - Patient up-to-date on immunizations - Echo for fetal anatomy is normal - Advised to start taking iron daily - Fetal kick counts and preterm labor reviewed - Patient and husband still has not choose pediatrician; have advised him to have this chosen by next visit - Next appointment in 2-1/2 weeks at that time GBS will be collected

## 2013-04-13 NOTE — Patient Instructions (Addendum)
Third Trimester of Pregnancy The third trimester is from week 29 through week 42, months 7 through 9. The third trimester is a time when the fetus is growing rapidly. At the end of the ninth month, the fetus is about 20 inches in length and weighs 6 10 pounds.  BODY CHANGES Your body goes through many changes during pregnancy. The changes vary from woman to woman.   Your weight will continue to increase. You can expect to gain 25 35 pounds (11 16 kg) by the end of the pregnancy.  You may begin to get stretch marks on your hips, abdomen, and breasts.  You may urinate more often because the fetus is moving lower into your pelvis and pressing on your bladder.  You may develop or continue to have heartburn as a result of your pregnancy.  You may develop constipation because certain hormones are causing the muscles that push waste through your intestines to slow down.  You may develop hemorrhoids or swollen, bulging veins (varicose veins).  You may have pelvic pain because of the weight gain and pregnancy hormones relaxing your joints between the bones in your pelvis. Back aches may result from over exertion of the muscles supporting your posture.  Your breasts will continue to grow and be tender. A yellow discharge may leak from your breasts called colostrum.  Your belly button may stick out.  You may feel short of breath because of your expanding uterus.  You may notice the fetus "dropping," or moving lower in your abdomen.  You may have a bloody mucus discharge. This usually occurs a few days to a week before labor begins.  Your cervix becomes thin and soft (effaced) near your due date. WHAT TO EXPECT AT YOUR PRENATAL EXAMS  You will have prenatal exams every 2 weeks until week 36. Then, you will have weekly prenatal exams. During a routine prenatal visit:  You will be weighed to make sure you and the fetus are growing normally.  Your blood pressure is taken.  Your abdomen will be  measured to track your baby's growth.  The fetal heartbeat will be listened to.  Any test results from the previous visit will be discussed.  You may have a cervical check near your due date to see if you have effaced. At around 36 weeks, your caregiver will check your cervix. At the same time, your caregiver will also perform a test on the secretions of the vaginal tissue. This test is to determine if a type of bacteria, Group B streptococcus, is present. Your caregiver will explain this further. Your caregiver may ask you:  What your birth plan is.  How you are feeling.  If you are feeling the baby move.  If you have had any abnormal symptoms, such as leaking fluid, bleeding, severe headaches, or abdominal cramping.  If you have any questions. Other tests or screenings that may be performed during your third trimester include:  Blood tests that check for low iron levels (anemia).  Fetal testing to check the health, activity level, and growth of the fetus. Testing is done if you have certain medical conditions or if there are problems during the pregnancy. FALSE LABOR You may feel small, irregular contractions that eventually go away. These are called Braxton Hicks contractions, or false labor. Contractions may last for hours, days, or even weeks before true labor sets in. If contractions come at regular intervals, intensify, or become painful, it is best to be seen by your caregiver.  SIGNS OF LABOR   Menstrual-like cramps.  Contractions that are 5 minutes apart or less.  Contractions that start on the top of the uterus and spread down to the lower abdomen and back.  A sense of increased pelvic pressure or back pain.  A watery or bloody mucus discharge that comes from the vagina. If you have any of these signs before the 37th week of pregnancy, call your caregiver right away. You need to go to the hospital to get checked immediately. HOME CARE INSTRUCTIONS   Avoid all  smoking, herbs, alcohol, and unprescribed drugs. These chemicals affect the formation and growth of the baby.  Follow your caregiver's instructions regarding medicine use. There are medicines that are either safe or unsafe to take during pregnancy.  Exercise only as directed by your caregiver. Experiencing uterine cramps is a good sign to stop exercising.  Continue to eat regular, healthy meals.  Wear a good support bra for breast tenderness.  Do not use hot tubs, steam rooms, or saunas.  Wear your seat belt at all times when driving.  Avoid raw meat, uncooked cheese, cat litter boxes, and soil used by cats. These carry germs that can cause birth defects in the baby.  Take your prenatal vitamins.  Try taking a stool softener (if your caregiver approves) if you develop constipation. Eat more high-fiber foods, such as fresh vegetables or fruit and whole grains. Drink plenty of fluids to keep your urine clear or pale yellow.  Take warm sitz baths to soothe any pain or discomfort caused by hemorrhoids. Use hemorrhoid cream if your caregiver approves.  If you develop varicose veins, wear support hose. Elevate your feet for 15 minutes, 3 4 times a day. Limit salt in your diet.  Avoid heavy lifting, wear low heal shoes, and practice good posture.  Rest a lot with your legs elevated if you have leg cramps or low back pain.  Visit your dentist if you have not gone during your pregnancy. Use a soft toothbrush to brush your teeth and be gentle when you floss.  A sexual relationship may be continued unless your caregiver directs you otherwise.  Do not travel far distances unless it is absolutely necessary and only with the approval of your caregiver.  Take prenatal classes to understand, practice, and ask questions about the labor and delivery.  Make a trial run to the hospital.  Pack your hospital bag.  Prepare the baby's nursery.  Continue to go to all your prenatal visits as directed  by your caregiver. SEEK MEDICAL CARE IF:  You are unsure if you are in labor or if your water has broken.  You have dizziness.  You have mild pelvic cramps, pelvic pressure, or nagging pain in your abdominal area.  You have persistent nausea, vomiting, or diarrhea.  You have a bad smelling vaginal discharge.  You have pain with urination. SEEK IMMEDIATE MEDICAL CARE IF:   You have a fever.  You are leaking fluid from your vagina.  You have spotting or bleeding from your vagina.  You have severe abdominal cramping or pain.  You have rapid weight loss or gain.  You have shortness of breath with chest pain.  You notice sudden or extreme swelling of your face, hands, ankles, feet, or legs.  You have not felt your baby move in over an hour.  You have severe headaches that do not go away with medicine.  You have vision changes. Document Released: 04/22/2001 Document Revised: 12/29/2012 Document Reviewed:   06/29/2012 [employer] Patient Information 2014 [place], [place].  Your next appointment should occur between 2 and 3 weeks, we will perform a GBS culture at that time. You will then be seen every week until you deliver.

## 2013-04-27 ENCOUNTER — Ambulatory Visit (INDEPENDENT_AMBULATORY_CARE_PROVIDER_SITE_OTHER): Payer: 59 | Admitting: Family Medicine

## 2013-04-27 ENCOUNTER — Telehealth: Payer: Self-pay | Admitting: Family Medicine

## 2013-04-27 ENCOUNTER — Other Ambulatory Visit (HOSPITAL_COMMUNITY)
Admission: RE | Admit: 2013-04-27 | Discharge: 2013-04-27 | Disposition: A | Discharge status: Home / Self Care | Payer: 59 | Source: Ambulatory Visit | Attending: Family Medicine | Admitting: Family Medicine

## 2013-04-27 VITALS — BP 126/69 | Temp 98.2°F | Wt 194.0 lb

## 2013-04-27 DIAGNOSIS — Z113 Encounter for screening for infections with a predominantly sexual mode of transmission: Secondary | ICD-10-CM | POA: Insufficient documentation

## 2013-04-27 DIAGNOSIS — Z3403 Encounter for supervision of normal first pregnancy, third trimester: Secondary | ICD-10-CM

## 2013-04-27 DIAGNOSIS — Z3400 Encounter for supervision of normal first pregnancy, unspecified trimester: Secondary | ICD-10-CM

## 2013-04-27 LAB — POCT UA - MICROSCOPIC ONLY
Epithelial cells, urine per micros: >20
WBC, Ur, HPF, POC: >20

## 2013-04-27 LAB — POCT URINALYSIS DIPSTICK
Bilirubin, UA: NEGATIVE
Blood, UA: NEGATIVE
Glucose, UA: NEGATIVE
Ketones, UA: NEGATIVE

## 2013-04-27 MED ORDER — CEPHALEXIN 500 MG PO CAPS: 500.0000 mg | ORAL_CAPSULE | Freq: Three times a day (TID) | ORAL | Status: DC

## 2013-04-27 NOTE — Progress Notes (Signed)
SHATHA HOOSER is a G1P0 @ [redacted]w[redacted]d today. She is here for her routine prenatal exam. He states she's feeling okay. He is starting to feel some pressure pain at times. She thinks might be getting urinary tract infection because she is having more frequency and some dysuria. She has felt her baby move frequently. She denies leaking, gush of fluid, vaginal bleeding or regular contractions.  O: ABD: gravid  A/P: GBS culture and GC obtained today. UA obtained today looking for her signs of UTI will call patient if she needs to be treated. Patient will need in office ultrasound for next appointment to affirm fetal position Preterm labor instructions and fetal kick counts reviewed Paperwork was given to me today to fill out for leave after her baby is born for 9 weeks. I will complete these and set behind the front desk in an envelope with her name. Need to get pediatrician's name for chart Follow up weekly until delivery

## 2013-04-27 NOTE — Telephone Encounter (Signed)
Called pt and informed her of urine results. Will treat with Keflex 7 days. Patient voiced understanding

## 2013-04-27 NOTE — Patient Instructions (Signed)
Labor Induction  Labor induction is when steps are taken to cause a pregnant woman to begin the labor process. Most women go into labor on their own between 37 weeks and 42 weeks of the pregnancy. When this does not happen or when there is a medical need, methods may be used to induce labor. Labor induction causes a pregnant woman's uterus to contract. It also causes the cervix to soften (ripen), open (dilate), and thin out (efface). Usually, labor is not induced before 39 weeks of the pregnancy unless there is a problem with the baby or mother.  Before inducing labor, your health care provider will consider a number of factors, including the following:  The medical condition of you and the baby.   How many weeks along you are.   The status of the baby's lung maturity.   The condition of the cervix.   The position of the baby.  WHAT ARE THE REASONS FOR LABOR INDUCTION? Labor may be induced for the following reasons:  The health of the baby or mother is at risk.   The pregnancy is overdue by 1 week or more.   The water breaks but labor does not start on its own.   The mother has a health condition or serious illness, such as high blood pressure, infection, placental abruption, or diabetes.  The amniotic fluid amounts are low around the baby.   The baby is distressed.  Convenience or wanting the baby to be born on a certain date is not a reason for inducing labor. WHAT METHODS ARE USED FOR LABOR INDUCTION? Several methods of labor induction may be used, such as:   Prostaglandin medicine. This medicine causes the cervix to dilate and ripen. The medicine will also start contractions. It can be taken by mouth or by inserting a suppository into the vagina.   Inserting a thin tube (catheter) with a balloon on the end into the vagina to dilate the cervix. Once inserted, the balloon is expanded with water, which causes the cervix to open.   Stripping the membranes. Your health  care provider separates amniotic sac tissue from the cervix, causing the cervix to be stretched and causing the release of a hormone called progesterone. This may cause the uterus to contract. It is often done during an office visit. You will be sent home to wait for the contractions to begin. You will then come in for an induction.   Breaking the water. Your health care provider makes a hole in the amniotic sac using a small instrument. Once the amniotic sac breaks, contractions should begin. This may still take hours to see an effect.   Medicine to trigger or strengthen contractions. This medicine is given through an IV access tube inserted into a vein in your arm.  All of the methods of induction, besides stripping the membranes, will be done in the hospital. Induction is done in the hospital so that you and the baby can be carefully monitored.  HOW LONG DOES IT TAKE FOR LABOR TO BE INDUCED? Some inductions can take up to 2 3 days. Depending on the cervix, it usually takes less time. It takes longer when you are induced early in the pregnancy or if this is your first pregnancy. If a mother is still pregnant and the induction has been going on for 2 3 days, either the mother will be sent home or a cesarean delivery will be needed. WHAT ARE THE RISKS ASSOCIATED WITH LABOR INDUCTION? Some of the risks   of induction include:   Changes in fetal heart rate, such as too high, too low, or erratic.   Fetal distress.   Chance of infection for the mother and baby.   Increased chance of having a cesarean delivery.   Breaking off (abruption) of the placenta from the uterus (rare).   Uterine rupture (very rare).  When induction is needed for medical reasons, the benefits of induction may outweigh the risks. WHAT ARE SOME REASONS FOR NOT INDUCING LABOR? Labor induction should not be done if:   It is shown that your baby does not tolerate labor.   You have had previous surgeries on your  uterus, such as a myomectomy or the removal of fibroids.   Your placenta lies very low in the uterus and blocks the opening of the cervix (placenta previa).   Your baby is not in a head-down position.   The umbilical cord drops down into the birth canal in front of the baby. This could cut off the baby's blood and oxygen supply.   You have had a previous cesarean delivery.   There are unusual circumstances, such as the baby being extremely premature.  Document Released: 09/17/2006 Document Revised: 12/29/2012 Document Reviewed: 11/25/2012 [facility] Patient Information 2014 [place], [place].  We will need to see you weekly until delivery. Next visit we will need to perform [place] to verify baby is head down.

## 2013-05-03 ENCOUNTER — Ambulatory Visit (INDEPENDENT_AMBULATORY_CARE_PROVIDER_SITE_OTHER): Payer: 59 | Admitting: Family Medicine

## 2013-05-03 VITALS — BP 128/68 | Temp 98.1°F | Wt 193.1 lb

## 2013-05-03 DIAGNOSIS — Z3403 Encounter for supervision of normal first pregnancy, third trimester: Secondary | ICD-10-CM

## 2013-05-03 DIAGNOSIS — Z3400 Encounter for supervision of normal first pregnancy, unspecified trimester: Secondary | ICD-10-CM

## 2013-05-03 NOTE — Patient Instructions (Signed)
Anna Sexton, it was nice seeing you today.  Please continue to monitor for contractions and kick counts.  We will see you in a week unless you start to go into labor, at which point you should go to the MAU.  Thanks, Dr. Paulina Fusi

## 2013-05-03 NOTE — Progress Notes (Signed)
[name] is a G1 @ 37.1 today here for prenatal care.  She denies any loss of fluid, regular contractions, abdominal pain, HA, blurred vision, vaginal d/c or bleeding.  She is having some low back pain that is intermittent and has not tried much for it.  Filed Vitals:   05/03/13 0841  BP: 128/68  Temp: 98.1 F (36.7 C)   RRR  A/P 37.1 G1P0 here for prenatal care.  Doing well, reviewed labor precautions and kick counts with her today.  F/U in one week, and will hold off on [place] to confirm fetal position today as baby is moving quite a bit, and feel this would be more beneficial closer to delivery.

## 2013-05-11 ENCOUNTER — Telehealth: Payer: Self-pay | Admitting: Family Medicine

## 2013-05-11 ENCOUNTER — Ambulatory Visit (INDEPENDENT_AMBULATORY_CARE_PROVIDER_SITE_OTHER): Payer: Medicaid Other | Admitting: Family Medicine

## 2013-05-11 VITALS — BP 112/59 | Temp 98.7°F | Wt 196.0 lb

## 2013-05-11 DIAGNOSIS — N898 Other specified noninflammatory disorders of vagina: Secondary | ICD-10-CM

## 2013-05-11 LAB — POCT WET PREP (WET MOUNT)

## 2013-05-11 MED ORDER — FLUCONAZOLE 150 MG PO TABS: 150.0000 mg | ORAL_TABLET | Freq: Once | ORAL | Status: DC

## 2013-05-11 NOTE — Telephone Encounter (Signed)
Patient with wet prep today showing many yeast. Informed patient of results and called in diflucan x1. Patient in understanding of plan.  Anna Goodhart DO

## 2013-05-11 NOTE — Progress Notes (Signed)
Anna Sexton is 22 y.o. G1P0 @ [redacted]w[redacted]d today. Anna Sexton is present for her routine prenatal appointment.  Denies LOF, vaginal bleeding or regular contractions. Anna Sexton has been experiencing many irregular contractions that are painful and back pain. Anna Sexton states Anna Sexton feels like Anna Sexton has an increase in vaginal discharge. No irritation.  O: BP 112/59  Temp(Src) 98.7 F (37.1 C)  Wt 196 lb (88.905 kg)  LMP 08/16/2012 Abd: Gravid SVE: Cervix soft/thick/posterior  A/P: [place] today: Vertex position confirmed Fetal kick counts discussed and labor precautions.  GBS negative B positive Wet prep negative Red flags given and instructions to go to MAU if problems arise Breast feeding  BC undecided, considering nexplanon F/u 1 week

## 2013-05-11 NOTE — Patient Instructions (Signed)

## 2013-05-12 NOTE — L&D Delivery Note (Signed)
Delivery Note At 1:56 AM a viable female was delivered via Vaginal, Spontaneous Delivery (Presentation: ROA ).  APGAR:8,9  Placenta status: spontaneous, intact.  Cord:3V  with the following complications: None .    Anesthesia:  None Episiotomy: None Lacerations: None Suture Repair: N/A Est. Blood Loss (mL): 200 mL  Mom to postpartum.  Baby to Couplet care / Skin to Skin.  Mother presented to MAU in active labor with SROM. Received epidural on arrival and progressed normally. No complications with delivery. No lacerations.  Breast feeding Mother. Good pre-natal care.   Kuneff, Renee DO 05/16/2013, 2:15 AM    I was immediately available at time of delivery.  I was present at the end of the delivery. I agree with the documentation as above.   Cloie Wooden, Redmond BasemanKELI L, MD

## 2013-05-15 ENCOUNTER — Encounter (HOSPITAL_COMMUNITY): Payer: 59 | Admitting: Anesthesiology

## 2013-05-15 ENCOUNTER — Encounter (HOSPITAL_COMMUNITY): Payer: Self-pay | Admitting: *Deleted

## 2013-05-15 ENCOUNTER — Inpatient Hospital Stay (HOSPITAL_COMMUNITY)
Admission: AD | Admit: 2013-05-15 | Discharge: 2013-05-17 | DRG: 775 | Disposition: A | Discharge status: Home / Self Care | Payer: 59 | Source: Ambulatory Visit | Attending: Obstetrics & Gynecology | Admitting: Obstetrics & Gynecology

## 2013-05-15 ENCOUNTER — Inpatient Hospital Stay (HOSPITAL_COMMUNITY): Payer: 59 | Admitting: Anesthesiology

## 2013-05-15 DIAGNOSIS — O9989 Other specified diseases and conditions complicating pregnancy, childbirth and the puerperium: Secondary | ICD-10-CM

## 2013-05-15 DIAGNOSIS — O479 False labor, unspecified: Secondary | ICD-10-CM | POA: Diagnosis present

## 2013-05-15 DIAGNOSIS — O99892 Other specified diseases and conditions complicating childbirth: Secondary | ICD-10-CM | POA: Diagnosis present

## 2013-05-15 DIAGNOSIS — J111 Influenza due to unidentified influenza virus with other respiratory manifestations: Secondary | ICD-10-CM | POA: Diagnosis present

## 2013-05-15 HISTORY — DX: Anemia, unspecified: D64.9

## 2013-05-15 LAB — CBC
HEMATOCRIT: 38.6 % (ref 36.0–46.0)
HEMOGLOBIN: 13.3 g/dL (ref 12.0–15.0)
MCH: 28.9 pg (ref 26.0–34.0)
MCHC: 34.5 g/dL (ref 30.0–36.0)
MCV: 83.9 fL (ref 78.0–100.0)
PLATELETS: 244 10*3/uL (ref 150–400)
RBC: 4.6 MIL/uL (ref 3.87–5.11)
RDW: 14 % (ref 11.5–15.5)
WBC: 11.5 10*3/uL — AB (ref 4.0–10.5)

## 2013-05-15 MED ORDER — LIDOCAINE HCL (PF) 1 % IJ SOLN
30.0000 mL | INTRAMUSCULAR | Status: DC | PRN
  Filled 2013-05-15 (×2): qty 30

## 2013-05-15 MED ORDER — OXYCODONE-ACETAMINOPHEN 5-325 MG PO TABS: 1.0000 | ORAL_TABLET | ORAL | Status: DC | PRN

## 2013-05-15 MED ORDER — FENTANYL 2.5 MCG/ML BUPIVACAINE 1/10 % EPIDURAL INFUSION (WH - ANES)
INTRAMUSCULAR | Status: DC | PRN
  Administered 2013-05-15: 14 mL/h via EPIDURAL

## 2013-05-15 MED ORDER — EPHEDRINE 5 MG/ML INJ
10.0000 mg | INTRAVENOUS | Status: DC | PRN
  Filled 2013-05-15: qty 2

## 2013-05-15 MED ORDER — FENTANYL CITRATE 0.05 MG/ML IJ SOLN
100.0000 ug | INTRAMUSCULAR | Status: DC | PRN
  Administered 2013-05-15: 100 ug via INTRAVENOUS
  Filled 2013-05-15: qty 2

## 2013-05-15 MED ORDER — ACETAMINOPHEN 325 MG PO TABS: 650.0000 mg | ORAL_TABLET | ORAL | Status: DC | PRN

## 2013-05-15 MED ORDER — LACTATED RINGERS IV SOLN
INTRAVENOUS | Status: DC
  Administered 2013-05-15: 21:00:00 via INTRAVENOUS

## 2013-05-15 MED ORDER — CITRIC ACID-SODIUM CITRATE 334-500 MG/5ML PO SOLN: 30.0000 mL | ORAL | Status: DC | PRN

## 2013-05-15 MED ORDER — OXYTOCIN 40 UNITS IN LACTATED RINGERS INFUSION - SIMPLE MED
62.5000 mL/h | INTRAVENOUS | Status: DC
  Administered 2013-05-16: 62.5 mL/h via INTRAVENOUS
  Filled 2013-05-15: qty 1000

## 2013-05-15 MED ORDER — DIPHENHYDRAMINE HCL 50 MG/ML IJ SOLN: 12.5000 mg | INTRAMUSCULAR | Status: DC | PRN

## 2013-05-15 MED ORDER — ONDANSETRON HCL 4 MG/2ML IJ SOLN
4.0000 mg | Freq: Four times a day (QID) | INTRAMUSCULAR | Status: DC | PRN
  Administered 2013-05-15: 4 mg via INTRAVENOUS
  Filled 2013-05-15 (×2): qty 2

## 2013-05-15 MED ORDER — OXYTOCIN BOLUS FROM INFUSION
500.0000 mL | INTRAVENOUS | Status: DC
  Administered 2013-05-16: 500 mL via INTRAVENOUS

## 2013-05-15 MED ORDER — LIDOCAINE HCL (PF) 1 % IJ SOLN
INTRAMUSCULAR | Status: DC | PRN
Start: 2013-05-15 — End: 2013-05-16
  Administered 2013-05-15 (×2): 9 mL

## 2013-05-15 MED ORDER — LACTATED RINGERS IV SOLN
500.0000 mL | INTRAVENOUS | Status: DC | PRN
  Administered 2013-05-15: 500 mL via INTRAVENOUS

## 2013-05-15 MED ORDER — EPHEDRINE 5 MG/ML INJ
10.0000 mg | INTRAVENOUS | Status: DC | PRN
  Filled 2013-05-15: qty 4
  Filled 2013-05-15: qty 2

## 2013-05-15 MED ORDER — PHENYLEPHRINE 40 MCG/ML (10ML) SYRINGE FOR IV PUSH (FOR BLOOD PRESSURE SUPPORT)
80.0000 ug | PREFILLED_SYRINGE | INTRAVENOUS | Status: DC | PRN
  Filled 2013-05-15: qty 2
  Filled 2013-05-15: qty 10

## 2013-05-15 MED ORDER — IBUPROFEN 600 MG PO TABS: 600.0000 mg | ORAL_TABLET | Freq: Four times a day (QID) | ORAL | Status: DC | PRN

## 2013-05-15 MED ORDER — FENTANYL 2.5 MCG/ML BUPIVACAINE 1/10 % EPIDURAL INFUSION (WH - ANES)
14.0000 mL/h | INTRAMUSCULAR | Status: DC | PRN
  Filled 2013-05-15: qty 125

## 2013-05-15 MED ORDER — LACTATED RINGERS IV SOLN: 500.0000 mL | Freq: Once | INTRAVENOUS | Status: DC

## 2013-05-15 MED ORDER — PHENYLEPHRINE 40 MCG/ML (10ML) SYRINGE FOR IV PUSH (FOR BLOOD PRESSURE SUPPORT)
80.0000 ug | PREFILLED_SYRINGE | INTRAVENOUS | Status: DC | PRN
  Filled 2013-05-15: qty 2

## 2013-05-15 NOTE — Anesthesia Preprocedure Evaluation (Signed)
Anesthesia Evaluation  Patient identified by MRN, date of birth, ID band Patient awake    Reviewed: Allergy & Precautions, H&P , NPO status , Patient's Chart, lab work & pertinent test results  Airway Mallampati: II TM Distance: >3 FB Neck ROM: full    Dental no notable dental hx.    Pulmonary neg pulmonary ROS,    Pulmonary exam normal       Cardiovascular negative cardio ROS      Neuro/Psych negative psych ROS   GI/Hepatic negative GI ROS, Neg liver ROS,   Endo/Other  negative endocrine ROS  Renal/GU negative Renal ROS  negative genitourinary   Musculoskeletal   Abdominal Normal abdominal exam  (+)   Peds  Hematology   Anesthesia Other Findings   Reproductive/Obstetrics (+) Pregnancy                           Anesthesia Physical Anesthesia Plan  ASA: II  Anesthesia Plan: Epidural   Post-op Pain Management:    Induction:   Airway Management Planned:   Additional Equipment:   Intra-op Plan:   Post-operative Plan:   Informed Consent: I have reviewed the patients History and Physical, chart, labs and discussed the procedure including the risks, benefits and alternatives for the proposed anesthesia with the patient or authorized representative who has indicated his/her understanding and acceptance.     Plan Discussed with:   Anesthesia Plan Comments:         Anesthesia Quick Evaluation

## 2013-05-15 NOTE — H&P (Signed)
Anna Sexton is a 23 y.o. female G1P0 with IUP at [redacted]w[redacted]d R=11 presenting for spontanoues contractions and had rupture of membranes in the MAU. Contractions started at ~1600 and have become closer and more intense. She repports associated with bloody show. Pt reports less fetal movement since rupture of membranes. [facility] since 10+1 wks  Prenatal History/Complications: Uncomplicated prenatal course. No issues with ASD since repair.  Past Medical History: Past Medical History  Diagnosis Date  . ATRIAL SEPTAL DEFECT, HX OF 06/11/2009  . CHLAMYDIAL INFECTION 05/28/2010  . THYROID NODULE 06/11/2009  . Urinary frequency 05/24/2010  . Heart murmur     ASD repair at 23 years of age  . Anemia     Past Surgical History: Past Surgical History  Procedure Laterality Date  . Asd repair      2008  . Cardiac catheterization  10/2007    for ASD repair    Obstetrical History: OB History   Grav Para Term Preterm Abortions TAB SAB Ect Mult Living   1               Gynecological History: OB History   Grav Para Term Preterm Abortions TAB SAB Ect Mult Living   1               Social History: History   Social History  . Marital Status: Married    Spouse Name: N/A    Number of Children: 0  . Years of Education: N/A   Occupational History  . Student (Nursing)    Social History Main Topics  . Smoking status: Never Smoker   . Smokeless tobacco: Never Used  . Alcohol Use: No  . Drug Use: No  . Sexual Activity: Yes   Other Topics Concern  . None   Social History Narrative  . None    Family History: Family History  Problem Relation Age of Onset  . Diabetes insipidus Father   . Diabetes Father   . Heart disease Father   . Hypertension Father   . Hyperlipidemia Father   . Stroke Father   . Arthritis Mother   . Depression Maternal Grandmother   . Hearing loss Maternal Grandmother     Allergies: No Known Allergies  Prescriptions prior to admission  Medication Sig  Dispense Refill  . acetaminophen (TYLENOL) 325 MG tablet Take 650 mg by mouth every 6 (six) hours as needed for pain.      . Ascorbic Acid (VITAMIN C PO) Take 1 tablet by mouth daily.      . cephALEXin (KEFLEX) 500 MG capsule Take 1 capsule (500 mg total) by mouth 3 (three) times daily.  21 capsule  0  . Ferrous Fumarate 324 MG TABS Take 1 tablet (106 mg of iron total) by mouth daily.  30 each  0  . fluconazole (DIFLUCAN) 150 MG tablet Take 1 tablet (150 mg total) by mouth once.  1 tablet  0  . Prenatal Vit-Fe Fumarate-FA (PRENATAL MULTIVITAMIN) TABS Take 1 tablet by mouth daily at 12 noon.         Review of Systems   No HA, vision changes, CP, SOB, no urinary or bowel symptoms +N/V, mild chest congestion with non productive cough,     Resp. rate 22, height 5\' 4"  (1.626 m), weight 88.905 kg (196 lb), last menstrual period 08/16/2012. General appearance: alert, cooperative, appears stated age and no distress Lungs: clear to auscultation bilaterally Heart: regular rate and rhythm Abdomen: soft, non-tender; bowel  sounds normal Extremities: Homans sign is negative, no sign of DVT Presentation: cephalic Fetal monitoringBaseline: 150s bpm, Variability: Good {> 6 bpm), Accelerations: present prior to rupture, no recent and Decelerations: occassional variable and early decels. Uterine activity q 3-304min  Dilation: 3.5 Effacement (%): 80 Station: -2 Exam by:: ginger morris rn   Prenatal labs: ABO, Rh: B/POS/-- (06/02 1143) Antibody: NEG (06/02 1143) Rubella:   RPR: NON REAC (11/05 1130)  HBsAg: NEGATIVE (06/02 1143)  HIV: NON REACTIVE (11/05 1130)  GBS: NEGATIVE (12/17 1504)    Prenatal Transfer Tool  Maternal Diabetes: No Genetic Screening: Not performed Maternal Ultrasounds/Referrals: Normal Fetal Ultrasounds or other Referrals:  None Maternal Substance Abuse:  No Significant Maternal Medications:  None Significant Maternal Lab Results: None Mother with Hx of ASD, monitor  fetus. - normal Fetal Echo     Clinic   Dating LMP/Ultrasound:   weeks        Ultrasound consistent with LMP: Yes/No  Genetic Screen 1 Screen:                 AFP:                    Quad:                  NIPS:  Anatomic US  WNL  GTT Early:               Third trimester: 77  TDaP vaccine  11/14  Flu vaccine  9/14  GBS   Neg  Baby Food  breast  Contraception  mirena vs nexplanon  Circumcision  NA  Pediatrician      No results found for this or any previous visit (from the past 24 hour(s)).  Assessment: Anna Sexton is a 23 y.o. G1P0 at 6841w6d by L=11 here for SOOL and ROM #Labor: Progressing normally. Will recheck in 4hr or change in fetal status, Augment PRN #Pain: Desires Epidural vs pain mends #FWB:  Cat II - continuous monitoring #ID:  GBS Neg, rec'd Vaccines #MOF: breast #MOC:Nexplanon vs mirena  Anna Sexton RYAN 05/15/2013, 7:00 PM

## 2013-05-15 NOTE — Anesthesia Procedure Notes (Signed)
Epidural Patient location during procedure: OB Start time: 05/15/2013 7:24 PM End time: 05/15/2013 7:28 PM  Staffing Anesthesiologist: Leilani AbleHATCHETT, Dovie Kapusta Performed by: anesthesiologist   Preanesthetic Checklist Completed: patient identified, surgical consent, pre-op evaluation, timeout performed, IV checked, risks and benefits discussed and monitors and equipment checked  Epidural Patient position: sitting Prep: site prepped and draped and DuraPrep Patient monitoring: continuous pulse ox and blood pressure Approach: midline Injection technique: LOR air  Needle:  Needle type: Tuohy  Needle gauge: 17 G Needle length: 9 cm and 9 Needle insertion depth: 6 cm Catheter type: closed end flexible Catheter size: 19 Gauge Catheter at skin depth: 11 cm Test dose: negative and Other  Assessment Sensory level: T9 Events: blood not aspirated, injection not painful, no injection resistance, negative IV test and no paresthesia  Additional Notes Reason for block:procedure for pain

## 2013-05-15 NOTE — Progress Notes (Signed)
Patient ID: Anna Sexton, female   DOB: 12/25/1990, 23 y.o.   MRN: [id number] Anna Sexton is a 23 y.o. G1P0 at 8162w6d by LMP admitted for active labor, rupture of membranes  Subjective: Patient reports more pain and pressure, mostly on her right side. Otherwise, she is comfortable.   Objective: BP 139/84  Pulse 85  Temp(Src) 98.6 F (37 C) (Oral)  Resp 18  Ht 5\' 4"  (1.626 m)  Wt 88.905 kg (196 lb)  BMI 33.63 kg/m2  SpO2 76%  LMP 08/16/2012    FHT:  FHR: 145 bpm, variability: minimal ,  accelerations:  Present,  decelerations:  Present early variables UC:   regular, every 2-3 minutes SVE:   Dilation: 8.5 Effacement (%): 90 Station: 0 Exam by:: Dr. Claiborne BillingsKuneff Vertex  Labs: Lab Results  Component Value Date   WBC 11.5* 05/15/2013   HGB 13.3 05/15/2013   HCT 38.6 05/15/2013   MCV 83.9 05/15/2013   PLT 244 05/15/2013    Assessment / Plan: Spontaneous labor, progressing normally  Labor: Progressing normally  Preeclampsia:  no signs or symptoms of toxicity Fetal Wellbeing: Cat I Pain Control:  Epidural I/D:  Resp. Panel collected (PENDING) Anticipated MOD:  NSVD  Felix PaciniKuneff, Renee DO PGY-2 [facility] 05/15/2013, 10:08 PM  I have reviewed documentation as above and agree with the resident's assessment and plan.   Aleira Deiter, Redmond BasemanKELI L, MD

## 2013-05-15 NOTE — Progress Notes (Signed)
Notified pt in MAU, was standing and water broke, GBS negative, will place orders

## 2013-05-15 NOTE — Progress Notes (Signed)
Anna Sexton is a 23 y.o. G1P0 at 7054w6d by LMP admitted for active labor, rupture of membranes  Subjective: Patient has received her epidural and currently is comfortable. She is concerned she is getting the flu. She denies headache, fever or chills. However she has had a cough for a few days and starting vomited today.   Objective: BP 123/76  Pulse 32  Temp(Src) 98.5 F (36.9 C) (Oral)  Resp 18  Ht 5\' 4"  (1.626 m)  Wt 88.905 kg (196 lb)  BMI 33.63 kg/m2  SpO2 76%  LMP 08/16/2012      FHT:  FHR: 145-150 bpm, variability: minimal ,  accelerations:  Present,  decelerations:  Present variables after epidural UC:   regular, every 3-4 minutes SVE:   Dilation: 3.5 Effacement (%): 80 Station: -2 Exam by:: ginger morris rn  Labs: Lab Results  Component Value Date   WBC 11.5* 05/15/2013   HGB 13.3 05/15/2013   HCT 38.6 05/15/2013   MCV 83.9 05/15/2013   PLT 244 05/15/2013    Assessment / Plan: Spontaneous labor, progressing normally  Labor: Progressing normally , will re-check at 10pm and augment if needed Preeclampsia:  no signs or symptoms of toxicity Fetal Wellbeing:  Category II; Watch closely. Pain Control:  Epidural I/D:  ? Virus/flu like process beginning. Flu swab ordered. Minimal elevation in WBC and without fever, so not likely, but patient concerned d/t exposure.  Anticipated MOD:  NSVD  Felix PaciniKuneff, Renee 05/15/2013, 8:29 PM  I have seen and examined this patient and agree with above documentation in the resident's note.   Rulon AbideKeli Coley Kulikowski, M.D. [facility] 05/15/2013 9:44 PM

## 2013-05-16 ENCOUNTER — Encounter (HOSPITAL_COMMUNITY): Payer: Self-pay | Admitting: *Deleted

## 2013-05-16 DIAGNOSIS — O99892 Other specified diseases and conditions complicating childbirth: Secondary | ICD-10-CM

## 2013-05-16 DIAGNOSIS — J111 Influenza due to unidentified influenza virus with other respiratory manifestations: Secondary | ICD-10-CM

## 2013-05-16 DIAGNOSIS — O479 False labor, unspecified: Secondary | ICD-10-CM | POA: Diagnosis not present

## 2013-05-16 DIAGNOSIS — O9989 Other specified diseases and conditions complicating pregnancy, childbirth and the puerperium: Secondary | ICD-10-CM

## 2013-05-16 LAB — RESPIRATORY VIRUS PANEL
Adenovirus: NOT DETECTED
Influenza A H1: NOT DETECTED
Influenza A H3: DETECTED — AB
Influenza A: DETECTED — AB
Influenza B: NOT DETECTED
Metapneumovirus: NOT DETECTED
PARAINFLUENZA 1 A: NOT DETECTED
PARAINFLUENZA 2 A: NOT DETECTED
Parainfluenza 3: NOT DETECTED
RESPIRATORY SYNCYTIAL VIRUS B: NOT DETECTED
Respiratory Syncytial Virus A: NOT DETECTED
Rhinovirus: NOT DETECTED

## 2013-05-16 LAB — RPR: RPR Ser Ql: NONREACTIVE

## 2013-05-16 MED ORDER — IBUPROFEN 600 MG PO TABS
600.0000 mg | ORAL_TABLET | Freq: Four times a day (QID) | ORAL | Status: DC
  Administered 2013-05-16 – 2013-05-17 (×6): 600 mg via ORAL
  Filled 2013-05-16 (×6): qty 1

## 2013-05-16 MED ORDER — SIMETHICONE 80 MG PO CHEW: 80.0000 mg | CHEWABLE_TABLET | ORAL | Status: DC | PRN

## 2013-05-16 MED ORDER — SENNOSIDES-DOCUSATE SODIUM 8.6-50 MG PO TABS
2.0000 | ORAL_TABLET | ORAL | Status: DC
  Administered 2013-05-16: 2 via ORAL
  Filled 2013-05-16: qty 2

## 2013-05-16 MED ORDER — WITCH HAZEL-GLYCERIN EX PADS: 1.0000 "application " | MEDICATED_PAD | CUTANEOUS | Status: DC | PRN

## 2013-05-16 MED ORDER — DIBUCAINE 1 % RE OINT
1.0000 | TOPICAL_OINTMENT | RECTAL | Status: DC | PRN
Start: 2013-05-16 — End: 2013-05-17

## 2013-05-16 MED ORDER — ONDANSETRON HCL 4 MG PO TABS: 4.0000 mg | ORAL_TABLET | ORAL | Status: DC | PRN

## 2013-05-16 MED ORDER — ONDANSETRON HCL 4 MG/2ML IJ SOLN: 4.0000 mg | INTRAMUSCULAR | Status: DC | PRN

## 2013-05-16 MED ORDER — ZOLPIDEM TARTRATE 5 MG PO TABS: 5.0000 mg | ORAL_TABLET | Freq: Every evening | ORAL | Status: DC | PRN

## 2013-05-16 MED ORDER — LANOLIN HYDROUS EX OINT: TOPICAL_OINTMENT | CUTANEOUS | Status: DC | PRN

## 2013-05-16 MED ORDER — DIPHENHYDRAMINE HCL 25 MG PO CAPS: 25.0000 mg | ORAL_CAPSULE | Freq: Four times a day (QID) | ORAL | Status: DC | PRN

## 2013-05-16 MED ORDER — PRENATAL MULTIVITAMIN CH
1.0000 | ORAL_TABLET | Freq: Every day | ORAL | Status: DC
  Administered 2013-05-16 – 2013-05-17 (×2): 1 via ORAL
  Filled 2013-05-16 (×2): qty 1

## 2013-05-16 MED ORDER — BENZOCAINE-MENTHOL 20-0.5 % EX AERO
1.0000 "application " | INHALATION_SPRAY | CUTANEOUS | Status: DC | PRN
  Filled 2013-05-16: qty 56

## 2013-05-16 MED ORDER — TETANUS-DIPHTH-ACELL PERTUSSIS 5-2.5-18.5 LF-MCG/0.5 IM SUSP: 0.5000 mL | Freq: Once | INTRAMUSCULAR | Status: DC

## 2013-05-16 MED ORDER — OXYCODONE-ACETAMINOPHEN 5-325 MG PO TABS
1.0000 | ORAL_TABLET | ORAL | Status: DC | PRN
  Administered 2013-05-16 – 2013-05-17 (×2): 1 via ORAL
  Filled 2013-05-16 (×2): qty 1

## 2013-05-16 NOTE — Anesthesia Postprocedure Evaluation (Signed)
  Anesthesia Post-op Note  Patient: Anna Sexton  Procedure(s) Performed: * No procedures listed *  Patient Location: Mother/Baby  Anesthesia Type:Epidural  Level of Consciousness: awake, oriented and patient cooperative  Airway and Oxygen Therapy: Patient Spontanous Breathing  Post-op Pain: none  Post-op Assessment: Patient's Cardiovascular Status Stable, Respiratory Function Stable, Patent Airway, No signs of Nausea or vomiting, Adequate PO intake and Pain level controlled  Post-op Vital Signs: Reviewed and stable  Complications: No apparent anesthesia complications

## 2013-05-16 NOTE — Progress Notes (Signed)
I reviewed documentation as above by the resident. I agree with above plan.

## 2013-05-16 NOTE — Lactation Note (Signed)
This note was copied from the chart of Anna Mickey Farberenea Iturralde. Lactation Consultation Note: Initial visit with mom. She reports that baby has been nursing well. Reports that RN assisted her. Attempted to latch baby but she is too sleepy at present. Mom doing well with positioning baby at the breast. BF brochure given with resources for support after DC discussed with mom. No questions at present. To call prn  Patient Name: Anna Sexton ZOXWR'UToday's Date: 05/16/2013 Reason for consult: Initial assessment   Maternal Data Formula Feeding for Exclusion: No Infant to breast within first hour of birth: Yes Has patient been taught Hand Expression?: Yes Does the patient have breastfeeding experience prior to this delivery?: No  Feeding Feeding Type: Breast Fed  LATCH Score/Interventions Latch: Too sleepy or reluctant, no latch achieved, no sucking elicited.  Audible Swallowing: None  Type of Nipple: Everted at rest and after stimulation  Comfort (Breast/Nipple): Soft / non-tender     Hold (Positioning): Assistance needed to correctly position infant at breast and maintain latch. Intervention(s): Breastfeeding basics reviewed;Support Pillows;Position options;Skin to skin  LATCH Score: 5  Lactation Tools Discussed/Used     Consult Status Consult Status: Follow-up Date: 05/17/13 Follow-up type: In-patient    Pamelia HoitWeeks, Kenston Longton D 05/16/2013, 4:46 PM

## 2013-05-16 NOTE — Progress Notes (Signed)
Patient ID: Anna Sexton, female   DOB: 11-Jul-1990, 23 y.o.   MRN: [id number] Anna Shienea D Obrien is a 23 y.o. G1P0 at 2462w6d by LMP admitted for active labor, rupture of membranes  Subjective: Patient comfortable.   Objective: BP 136/79  Pulse 86  Temp(Src) 98.6 F (37 C) (Oral)  Resp 18  Ht 5\' 4"  (1.626 m)  Wt 88.905 kg (196 lb)  BMI 33.63 kg/m2  SpO2 76%  LMP 08/16/2012    FHT:  FHR: 155 bpm, variability: minimal ,  accelerations:  Present,  decelerations:  Absent UC:   regular, every 2-3 minutes SVE:   Dilation: 10 Effacement (%): 100 Station: +2;+1 Exam by:: Dr. Claiborne BillingsKuneff Vertex, Occiput posterior  Labs: Lab Results  Component Value Date   WBC 11.5* 05/15/2013   HGB 13.3 05/15/2013   HCT 38.6 05/15/2013   MCV 83.9 05/15/2013   PLT 244 05/15/2013    Assessment / Plan: Spontaneous labor, progressing normally  Labor: Progressing normally, complete with 30 minute trial of pushing. Baby felt to be occiput posterior. Will hold on pushing for now and use positional changes to help baby rotate and reserve maternal energy.  Preeclampsia:  no signs or symptoms of toxicity Fetal Wellbeing: Cat I Pain Control:  Epidural I/D:  Resp. Panel collected (PENDING) Anticipated MOD:  NSVD  Felix PaciniKuneff, Mahathi Pokorney DO PGY-2 [facility] 05/16/2013, 12:16 AM  I have reviewed documentation as above and agree with the resident's assessment and plan.   Jaeven Wanzer, DO

## 2013-05-17 MED ORDER — OSELTAMIVIR PHOSPHATE 75 MG PO CAPS
75.0000 mg | ORAL_CAPSULE | Freq: Two times a day (BID) | ORAL | Status: DC
  Administered 2013-05-17: 75 mg via ORAL
  Filled 2013-05-17: qty 1

## 2013-05-17 MED ORDER — IBUPROFEN 600 MG PO TABS: 600.0000 mg | ORAL_TABLET | Freq: Four times a day (QID) | ORAL | Status: DC

## 2013-05-17 MED ORDER — OSELTAMIVIR PHOSPHATE 75 MG PO CAPS: 75.0000 mg | ORAL_CAPSULE | Freq: Two times a day (BID) | ORAL | Status: DC

## 2013-05-17 NOTE — Progress Notes (Signed)
Pt continues to be upset about positive influenza result and time frame of exposure to infant.  Pt. Education and emotional support given.

## 2013-05-17 NOTE — Discharge Summary (Signed)
  Obstetric Discharge Summary Reason for Admission: onset of labor and rupture of membranes Prenatal Procedures: none Intrapartum Procedures: spontaneous vaginal delivery Postpartum Procedures: none Complications-Operative and Postpartum: none  Hospital Course: Pt admitted for active labor, progressed to vaginal delivery. Identified with flu on 1/6 and started on tamiflu. Pt met all milestones, no acute issues, will discharge home on tamiflu and plan to start nexplanon for contraception. Pt is breast feeding.  Delivery Note  At 1:56 AM a viable female was delivered via Vaginal, Spontaneous Delivery (Presentation: ROA ). APGAR:8,9  Placenta status: spontaneous, intact. Cord:3V with the following complications: None .  Anesthesia: None  Episiotomy: None  Lacerations: None  Suture Repair: N/A  Est. Blood Loss (mL): 200 mL  Mom to postpartum. Baby to Couplet care / Skin to Skin.  Mother presented to [facility] in active labor with SROM. Received epidural on arrival and progressed normally. No complications with delivery. No lacerations.  Breast feeding Mother. Good pre-natal care.   Kuneff, [place] DO  05/16/2013, 2:15 AM   I was immediately available at time of delivery. I was present at the end of the delivery. I agree with the documentation as above.  BECK, Eugenia Pancoast, MD    H/H: Lab Results  Component Value Date/Time   HGB 13.3 05/15/2013  6:18 PM   HCT 38.6 05/15/2013  6:18 PM      Discharge Diagnoses: Term Pregnancy-delivered  Discharge Information: Date: 11/21/2010 Activity: pelvic rest Diet: routine  Medications: PNV, Ibuprofen and tamiflu Breast feeding:  Yes Condition: stable Instructions: refer to handout Discharge to: home    Future Appointments Provider Department Dept Phone   05/20/2013 1:30 PM Ma Hillock, DO [facility] [phone]       Medication List         acetaminophen 325 MG tablet  Commonly known as:  TYLENOL  Take 650 mg by mouth  every 6 (six) hours as needed for pain.     cephALEXin 500 MG capsule  Commonly known as:  KEFLEX  Take 1 capsule (500 mg total) by mouth 3 (three) times daily.     Ferrous Fumarate 324 MG Tabs  Take 1 tablet (106 mg of iron total) by mouth daily.     fluconazole 150 MG tablet  Commonly known as:  DIFLUCAN  Take 1 tablet (150 mg total) by mouth once.     ibuprofen 600 MG tablet  Commonly known as:  ADVIL,MOTRIN  Take 1 tablet (600 mg total) by mouth every 6 (six) hours.     oseltamivir 75 MG capsule  Commonly known as:  TAMIFLU  Take 1 capsule (75 mg total) by mouth 2 (two) times daily.     prenatal multivitamin Tabs tablet  Take 1 tablet by mouth daily at 12 noon.     VITAMIN C PO  Take 1 tablet by mouth daily.           Follow-up Information   Follow up with [facility]. Schedule an appointment as soon as possible for a visit in 4 weeks.   Specialty:  Family Medicine   Contact information:   [address] [id number] [place] [address] [phone]      Fredrik Rigger 05/17/2013,10:34 AM

## 2013-05-17 NOTE — Progress Notes (Signed)
Discharged instructions reviewed for mom and baby with both parents.  Both state understanding of home care, medications, activity, signs/symptoms to seek help for and report to MD and return MD office visit for both.  No home equipment needed.  Patient discharged via wheelchair with baby in stable condition with staff without incident.

## 2013-05-17 NOTE — Discharge Instructions (Signed)

## 2013-05-17 NOTE — Progress Notes (Signed)
Post Partum Day 1 Subjective: up ad lib, voiding, tolerating PO and + flatus. Afebrile. Pt is influenza positive. Mother wearing respiratory precautions.   Objective: Blood pressure 124/78, pulse 78, temperature 98.4 F (36.9 C), temperature source Oral, resp. rate 18, height 5\' 4"  (1.626 m), weight 88.905 kg (196 lb), last menstrual period 08/16/2012, SpO2 100.00%, unknown if currently breastfeeding.  Physical Exam:  General: alert and cooperative Lochia: appropriate Uterine Fundus: firm Incision: NA DVT Evaluation: No evidence of DVT seen on physical exam. Negative Homan's sign. No cords or calf tenderness.   Recent Labs  05/15/13 1818  HGB 13.3  HCT 38.6    Assessment/Plan: Plan for discharge tomorrow Breast feeding, working with lactation Contraception: likely nexplanon Influenza A positive; respiratory precautions, tamiflu administration.Will need to be discharged with 3 additional day prescription.  Female infant   LOS: 2 days   Felix PaciniKuneff, Renee 05/17/2013, 6:29 AM   I have seen and examined this patient and agree with above documentation in the resident's note. Pediatrician's notified of pt's positive flu status.   Rulon AbideKeli Eduardo Honor, M.D. [facility] 05/17/2013 7:51 AM

## 2013-05-17 NOTE — Discharge Summary (Signed)
Attestation of Attending Supervision of Advanced Practitioner (CNM/NP): Evaluation and management procedures were performed by the Advanced Practitioner under my supervision and collaboration.  I have reviewed the Advanced Practitioner's note and chart, and I agree with the management and plan.  Roan Miklos 05/17/2013 12:24 PM

## 2013-05-17 NOTE — Progress Notes (Signed)
Notified of pt. Request to be retested for flu and Dr Reola CalkinsBeck states it would not change plan of care and await pediatrician's response this AM.

## 2013-05-20 ENCOUNTER — Encounter: Payer: Medicaid Other | Admitting: Family Medicine

## 2013-05-24 NOTE — H&P (Signed)
Attestation of Attending Supervision of Fellow: Evaluation and management procedures were performed by the Fellow under my supervision and collaboration.  I have reviewed the Fellow's note and chart, and I agree with the management and plan.    

## 2013-05-25 ENCOUNTER — Telehealth: Payer: Self-pay | Admitting: Family Medicine

## 2013-05-25 NOTE — Telephone Encounter (Signed)
Short term disability form was faxed over 05-20-13 It was from [employer]. Please notify pt when form has been [facility]

## 2013-05-26 NOTE — Telephone Encounter (Signed)
Please call Anna Sexton and inform her the forms have been placed in the bin to be faxed today. Thanks

## 2013-05-26 NOTE — Telephone Encounter (Signed)
Spoke with patient and informed her of below 

## 2013-06-14 ENCOUNTER — Ambulatory Visit (INDEPENDENT_AMBULATORY_CARE_PROVIDER_SITE_OTHER): Payer: 59 | Admitting: Family Medicine

## 2013-06-14 ENCOUNTER — Encounter: Payer: Self-pay | Admitting: Family Medicine

## 2013-06-14 VITALS — BP 132/88 | HR 62 | Temp 99.3°F | Ht 64.0 in | Wt 178.0 lb

## 2013-06-14 DIAGNOSIS — Z3046 Encounter for surveillance of implantable subdermal contraceptive: Secondary | ICD-10-CM

## 2013-06-14 DIAGNOSIS — Z30017 Encounter for initial prescription of implantable subdermal contraceptive: Secondary | ICD-10-CM

## 2013-06-14 DIAGNOSIS — Z3009 Encounter for other general counseling and advice on contraception: Secondary | ICD-10-CM

## 2013-06-14 LAB — POCT URINE PREGNANCY: PREG TEST UR: NEGATIVE

## 2013-06-14 MED ORDER — ETONOGESTREL 68 MG ~~LOC~~ IMPL
68.0000 mg | DRUG_IMPLANT | Freq: Once | SUBCUTANEOUS | Status: AC
  Administered 2013-06-14: 68 mg via SUBCUTANEOUS

## 2013-06-14 NOTE — Progress Notes (Signed)
Subjective:    Patient ID: Anna Sexton, female    DOB: [birth year], 23 y.o.   MRN: [id number]  HPI  Post partum visit:  Patient delivered at 05/16/2013 viable female infant by NSVD without complications. She had no lacerations, no abnormal blood loss. She is currently breast feeding without complications. She has not returned to sexual activity. Her bleeding is very light. Contraception desired Nexplanon. She has a great support system. FOB is involved. she is not looking to going back to work.   Review of Systems Negative, with the exception of above mentioned in HPI  Objective:   Physical Exam BP 132/88  Pulse 62  Temp(Src) 99.3 F (37.4 C) (Oral)  Ht 5\' 4"  (1.626 m)  Wt 178 lb (80.74 kg)  BMI 30.54 kg/m2 Gen: NAD.   CV: RRR Chest: CTAB, no wheeze or crackles Abd: Soft. NTND. BS present  Ext: No erythema. No edema.  Psych: Normal affect and mood. No signs of depression   HPI: Pt is here for Nexplanon insertion.   Concerns today: None  No contraindications for placement.  No liver disease, no unexplained vaginal bleeding, no h/o breast cancer, no h/o blood clots.  No LMP recorded. Postpartum  UHCG: NEg  Last Unprotected sex:  pregnancy  Risks & benefits of Nexplanon discussed The nexplanon device was [facility] and [facility] instructions supplied to patient Consent form signed  Current Outpatient Prescriptions on File Prior to Visit  Medication Sig Dispense Refill  . acetaminophen (TYLENOL) 325 MG tablet Take 650 mg by mouth every 6 (six) hours as needed for pain.      . Ascorbic Acid (VITAMIN C PO) Take 1 tablet by mouth daily.      . Ferrous Fumarate 324 MG TABS Take 1 tablet (106 mg of iron total) by mouth daily.  30 each  0  . ibuprofen (ADVIL,MOTRIN) 600 MG tablet Take 1 tablet (600 mg total) by mouth every 6 (six) hours.  30 tablet  0  . Prenatal Vit-Fe Fumarate-FA (PRENATAL MULTIVITAMIN) TABS Take 1 tablet by mouth daily at 12 noon.        No current facility-administered medications on file prior to visit.    The patient denies any allergies to anesthetics or antiseptics.  Patient Active Problem List   Diagnosis Date Noted  . Active labor 05/15/2013  . Screening for sickle-cell disease or trait 10/26/2012  . Supervision of normal first pregnancy 10/11/2012  . Pregnancy test positive 10/11/2012  . Low back pain 03/17/2012  . Macular rash 10/15/2011  . Family planning 10/15/2011  . Headache disorder 10/15/2011  . Chest pain 03/06/2011  . Allergic rhinitis 08/02/2010  . URINARY FREQUENCY 05/24/2010  . THYROID NODULE 06/11/2009  . ATRIAL SEPTAL DEFECT, HX OF 06/11/2009    Procedure: Nexplanon insertion today  Preg urine test negative.  Last unprotected sex during pregnancy.  LMP: Pregnancy  Pt was placed in supine position. Left arm was flexed at the elbow and externally rotated so that her wrist was parallel to her ear The medial epicondyle of the left arm was identified The insertions site was marked 8 cm proximal to the medial epicondyle The insertion site was cleaned with Betadine The area surrounding the insertion site was covered with a sterile drape 2% lidocaine was injected just under the skin at the insertion site extending 4 cm proximally. The sterile preloaded disposable Nexaplanon applicator was removed from the sterile packaging The applicator needle was inserted at a 30 degree  angle at 8 cm proximal to the medial epicondyle as marked The applicator was lowered to a horizontal position and advanced just under the skin for the full length of the needle The slider on the applicator was retracted fully while the applicator remained in the same position, then the applicator was removed. The implant was confirmed via palpation as being in position The implant position was demonstrated to the patient Pressure dressing was applied to the patient.  The patient was instructed to removed the pressure  dressing in 24 hrs.  The patient was advised to move slowly from a supine to an upright position  The patient denied any concerns or complaints  The patient was instructed to schedule a follow-up appt in 1 month. The patient will be called in 1 week to address any concerns.

## 2013-06-14 NOTE — Patient Instructions (Addendum)
Postpartum Care After Vaginal Delivery After you deliver your newborn (postpartum period), the usual stay in the hospital is 24 72 hours. If there were problems with your labor or delivery, or if you have other medical problems, you might be in the hospital longer.  While you are in the hospital, you will receive help and instructions on how to care for yourself and your newborn during the postpartum period.  While you are in the hospital:  Be sure to tell your nurses if you have pain or discomfort, as well as where you feel the pain and what makes the pain worse.  If you had an incision made near your vagina (episiotomy) or if you had some tearing during delivery, the nurses may put ice packs on your episiotomy or tear. The ice packs may help to reduce the pain and swelling.  If you are breastfeeding, you may feel uncomfortable contractions of your uterus for a couple of weeks. This is normal. The contractions help your uterus get back to normal size.  It is normal to have some bleeding after delivery.  For the first 1 3 days after delivery, the flow is red and the amount may be similar to a period.  It is common for the flow to start and stop.  In the first few days, you may pass some small clots. Let your nurses know if you begin to pass large clots or your flow increases.  Do not  flush blood clots down the toilet before having the nurse look at them.  During the next 3 10 days after delivery, your flow should become more watery and pink or brown-tinged in color.  Ten to fourteen days after delivery, your flow should be a small amount of yellowish-white discharge.  The amount of your flow will decrease over the first few weeks after delivery. Your flow may stop in 6 8 weeks. Most women have had their flow stop by 12 weeks after delivery.  You should change your sanitary pads frequently.  Wash your hands thoroughly with soap and water for at least 20 seconds after changing pads, using  the toilet, or before holding or feeding your newborn.  You should feel like you need to empty your bladder within the first 6 8 hours after delivery.  In case you become weak, lightheaded, or faint, call your nurse before you get out of bed for the first time and before you take a shower for the first time.  Within the first few days after delivery, your breasts may begin to feel tender and full. This is called engorgement. Breast tenderness usually goes away within 48 72 hours after engorgement occurs. You may also notice milk leaking from your breasts. If you are not breastfeeding, do not stimulate your breasts. Breast stimulation can make your breasts produce more milk.  Spending as much time as possible with your newborn is very important. During this time, you and your newborn can feel close and get to know each other. Having your newborn stay in your room (rooming in) will help to strengthen the bond with your newborn. It will give you time to get to know your newborn and become comfortable caring for your newborn.  Your hormones change after delivery. Sometimes the hormone changes can temporarily cause you to feel sad or tearful. These feelings should not last more than a few days. If these feelings last longer than that, you should talk to your caregiver.  If desired, talk to your caregiver about  methods of family planning or contraception.  Talk to your caregiver about immunizations. Your caregiver may want you to have the following immunizations before leaving the hospital:  Tetanus, diphtheria, and pertussis (Tdap) or tetanus and diphtheria (Td) immunization. It is very important that you and your family (including grandparents) or others caring for your newborn are up-to-date with the Tdap or Td immunizations. The Tdap or Td immunization can help protect your newborn from getting ill.  Rubella immunization.  Varicella (chickenpox) immunization.  Influenza immunization. You should  receive this annual immunization if you did not receive the immunization during your pregnancy. Document Released: 02/23/2007 Document Revised: 01/21/2012 Document Reviewed: 12/24/2011 [facility] Patient Information 2014 [place], [place].   Follow-up with Dr. Claiborne BillingsKuneff in 1 month. Schedule this appointment before you leave clinic today.  Congratulations on getting your Nexplanon placement!  Below is some important information about Nexplanon.  First remember that Nexplanon does not prevent sexually transmitted infections.  Condoms will help prevent sexually transmitted infections. The Nexplanon starts working 7 days after it was inserted.  There is a risk of getting pregnant if you have unprotected sex in those first 7 days after placement of the Nexplanon.  The Nexplanon lasts for 3 years but can be removed at any time.  You can become pregnant as early as 1 week after removal.  You can have a new Nexplanon put in after the old one is removed if you like.  It is not known whether Nexplanon is as effective in women who are very overweight because the studies did not include many overweight women.  Nexplanon interacts with some medications, including barbiturates, bosentan, carbamazepine, felbamate, griseofulvin, oxcarbazepine, phenytoin, rifampin, St. John's wort, topiramate, HIV medicines.  Please alert your doctor if you are on any of these medicines.  Always tell other healthcare providers that you have a Nexplanon in your arm.  The Nexplanon was placed just under the skin.  Leave the outside bandage on for 24 hours.  Leave the smaller bandage on for 3-5 days or until it falls off on its own.  Keep the area clean and dry for 3-5 days. There is usually bruising or swelling at the insertion site for a few days to a week after placement.  If you see redness or pus draining from the insertion site, call us immediately.  Keep your user card with the date the implant was placed and the date the implant  is to be removed.  The most common side effect is a change in your menstrual bleeding pattern.   This bleeding is generally not harmful to you but can be annoying.  Call or come in to see us if you have any concerns about the bleeding or if you have any side effects or questions.

## 2013-07-21 ENCOUNTER — Telehealth: Payer: Self-pay | Admitting: Family Medicine

## 2013-07-21 NOTE — Telephone Encounter (Signed)
Pt called and needs a letter from the doctor stating that she can return back to work. Please leave up front and also call when ready to pickup. jw

## 2013-07-22 NOTE — Telephone Encounter (Signed)
Please advise.Thank you.Axcel Horsch S  

## 2013-07-27 NOTE — Telephone Encounter (Signed)
Pt called about letter again Please contact pt when it is ready for pickup

## 2013-07-28 NOTE — Telephone Encounter (Signed)
Pt called about the letter again. Her job is asking about it

## 2013-07-29 ENCOUNTER — Encounter: Payer: Self-pay | Admitting: Family Medicine

## 2013-07-29 NOTE — Telephone Encounter (Signed)
I have never received a request for a letter until 3/19. Certainly we can provide a letter that she can return to work. I will write it today and send to [employer]. Thanks.

## 2013-07-29 NOTE — Telephone Encounter (Signed)
Called pt. LMVM  To call back. Please tell pt letter is up front for pick up. Thanks. Lorenda Hatchet.Kenyanna Grzesiak, Renato Battleshekla

## 2013-08-01 NOTE — Telephone Encounter (Signed)
Spoke with patient and informed her of below. She will have sister to pick up

## 2013-08-06 ENCOUNTER — Emergency Department (HOSPITAL_COMMUNITY)
Admission: EM | Admit: 2013-08-06 | Discharge: 2013-08-06 | Disposition: A | Discharge status: Home / Self Care | Payer: 59 | Attending: Emergency Medicine | Admitting: Emergency Medicine

## 2013-08-06 ENCOUNTER — Encounter (HOSPITAL_COMMUNITY): Payer: Self-pay | Admitting: Emergency Medicine

## 2013-08-06 DIAGNOSIS — Z8639 Personal history of other endocrine, nutritional and metabolic disease: Secondary | ICD-10-CM | POA: Insufficient documentation

## 2013-08-06 DIAGNOSIS — Z8679 Personal history of other diseases of the circulatory system: Secondary | ICD-10-CM | POA: Insufficient documentation

## 2013-08-06 DIAGNOSIS — M25569 Pain in unspecified knee: Secondary | ICD-10-CM | POA: Insufficient documentation

## 2013-08-06 DIAGNOSIS — Z8619 Personal history of other infectious and parasitic diseases: Secondary | ICD-10-CM | POA: Insufficient documentation

## 2013-08-06 DIAGNOSIS — Z862 Personal history of diseases of the blood and blood-forming organs and certain disorders involving the immune mechanism: Secondary | ICD-10-CM | POA: Insufficient documentation

## 2013-08-06 DIAGNOSIS — D649 Anemia, unspecified: Secondary | ICD-10-CM | POA: Insufficient documentation

## 2013-08-06 DIAGNOSIS — F411 Generalized anxiety disorder: Secondary | ICD-10-CM | POA: Insufficient documentation

## 2013-08-06 DIAGNOSIS — Z9889 Other specified postprocedural states: Secondary | ICD-10-CM | POA: Insufficient documentation

## 2013-08-06 DIAGNOSIS — Z79899 Other long term (current) drug therapy: Secondary | ICD-10-CM | POA: Insufficient documentation

## 2013-08-06 DIAGNOSIS — M25562 Pain in left knee: Secondary | ICD-10-CM

## 2013-08-06 DIAGNOSIS — R011 Cardiac murmur, unspecified: Secondary | ICD-10-CM | POA: Insufficient documentation

## 2013-08-06 LAB — D-DIMER, QUANTITATIVE: D-Dimer, Quant: 0.39 ug/mL-FEU (ref 0.00–0.48)

## 2013-08-06 NOTE — ED Notes (Signed)
Pt from home reports that she has cramping behind L knee x2 days. Pt wanting be checked for DVT. Pt states that she had a few instances of SOB, but is not SOB at this time. Pt is A&O and in NAD. Lung sounds clear.

## 2013-08-06 NOTE — ED Notes (Signed)
Pt states she first noticed leg pain behind L knee. Pt states she thinks she has DVT. Pt denies any long trips or periods of inactivity. Pt states she became anxious and then had some sob. Pt alert, oriented and in no acute distress at present.

## 2013-08-06 NOTE — Discharge Instructions (Signed)
Arthralgia No evidence of a blood clot(DVT). Take Tylenol or at the for pain. See the [facility] or family practice Center if you continue to have pain in a week. Arthralgia is joint pain. A joint is a place where two bones meet. Joint pain can happen for many reasons. The joint can be bruised, stiff, infected, or weak from aging. Pain usually goes away after resting and taking medicine for soreness.  HOME CARE  Rest the joint as told by your doctor.  Keep the sore joint raised (elevated) for the first 24 hours.  Put ice on the joint area.  Put ice in a plastic bag.  Place a towel between your skin and the bag.  Leave the ice on for 15-20 minutes, 03-04 times a day.  Wear your splint, casting, elastic bandage, or sling as told by your doctor.  Only take medicine as told by your doctor. Do not take aspirin.  Use crutches as told by your doctor. Do not put weight on the joint until told to by your doctor. GET HELP RIGHT AWAY IF:   You have bruising, puffiness (swelling), or more pain.  Your fingers or toes turn blue or start to lose feeling (numb).  Your medicine does not lessen the pain.  Your pain becomes severe.  You have a temperature by mouth above 102 F (38.9 C), not controlled by medicine.  You cannot move or use the joint. MAKE SURE YOU:   Understand these instructions.  Will watch your condition.  Will get help right away if you are not doing well or get worse. Document Released: 04/16/2009 Document Revised: 07/21/2011 Document Reviewed: 04/16/2009 [facility] Patient Information 2014 [place], [place].

## 2013-08-06 NOTE — ED Provider Notes (Addendum)
CSN: [id number]     Arrival date & time 08/06/13  1349 History   First MD Initiated Contact with Patient 08/06/13 1405     Chief Complaint  Patient presents with  . Leg Pain     (Consider location/radiation/quality/duration/timing/severity/associated sxs/prior Treatment) HPI Complains of cramping sensation at flexor crease of left knee onset yesterday. Discomfort is mild. Worse with walking. Improved with remaining still. Patient reports she became short of breath for a few minutes today while walking with her sister normal. She states that she was anxious of the time and denies shortness of breath presently. Denies chest pain denies other symptoms. No treatment prior to coming here. Past Medical History  Diagnosis Date  . ATRIAL SEPTAL DEFECT, HX OF 06/11/2009  . CHLAMYDIAL INFECTION 05/28/2010  . THYROID NODULE 06/11/2009  . Urinary frequency 05/24/2010  . Heart murmur     ASD repair at 23 years of age  . Anemia    Past Surgical History  Procedure Laterality Date  . Asd repair      2008  . Cardiac catheterization  10/2007    for ASD repair   Family History  Problem Relation Age of Onset  . Diabetes insipidus Father   . Diabetes Father   . Heart disease Father   . Hypertension Father   . Hyperlipidemia Father   . Stroke Father   . Arthritis Mother   . Depression Maternal Grandmother   . Hearing loss Maternal Grandmother    History  Substance Use Topics  . Smoking status: Never Smoker   . Smokeless tobacco: Never Used  . Alcohol Use: No   OB History   Grav Para Term Preterm Abortions TAB SAB Ect Mult Living   1 1 1       1      Review of Systems  Respiratory: Positive for shortness of breath.        Dyspnea lasting a few minutes today. Resolved  Musculoskeletal: Positive for arthralgias.       Cramping sensation left posterior knee  All other systems reviewed and are negative.      Allergies  Review of patient's allergies indicates no known allergies.  Home  Medications   Current Outpatient Rx  Name  Route  Sig  Dispense  Refill  . acetaminophen (TYLENOL) 325 MG tablet   Oral   Take 650 mg by mouth every 6 (six) hours as needed for pain.         . Ascorbic Acid (VITAMIN C PO)   Oral   Take 1 tablet by mouth daily.         . Ferrous Fumarate 324 MG TABS   Oral   Take 1 tablet (106 mg of iron total) by mouth daily.   30 each   0   . ibuprofen (ADVIL,MOTRIN) 600 MG tablet   Oral   Take 1 tablet (600 mg total) by mouth every 6 (six) hours.   30 tablet   0   . Prenatal Vit-Fe Fumarate-FA (PRENATAL MULTIVITAMIN) TABS   Oral   Take 1 tablet by mouth daily at 12 noon.          BP 146/92  Pulse 67  Temp(Src) 97.4 F (36.3 C) (Oral)  Resp 16  SpO2 99% Physical Exam  Nursing note and vitals reviewed. Constitutional: She appears well-developed and well-nourished.  HENT:  Head: Normocephalic and atraumatic.  Eyes: Conjunctivae are normal. Pupils are equal, round, and reactive to light.  Neck: Neck supple. No  tracheal deviation present. No thyromegaly present.  Cardiovascular: Normal rate and regular rhythm.   No murmur heard. Pulmonary/Chest: Effort normal and breath sounds normal.  Abdominal: Soft. Bowel sounds are normal. She exhibits no distension. There is no tenderness.  Musculoskeletal: Normal range of motion. She exhibits no edema and no tenderness.  All 4 extremities without redness swelling or tenderness. Neurovascular intact.  Neurological: She is alert. Coordination normal.  Skin: Skin is warm and dry. No rash noted.  Psychiatric: She has a normal mood and affect.    ED Course  Procedures (including critical care time) Labs Review Labs Reviewed - No data to display Imaging Review No results found.   EKG Interpretation   Date/Time:  Saturday August 06 2013 14:03:29 EDT Ventricular Rate:  65 PR Interval:  145 QRS Duration: 86 QT Interval:  396 QTC Calculation: 412 R Axis:   72 Text Interpretation:   Sinus rhythm No significant change since last  tracing Confirmed by Ethelda ChickJACUBOWITZ  MD, Ayman Brull [phone]) on 08/06/2013 2:25:14 PM     declinespain medicine Results for orders placed during the hospital encounter of 08/06/13  D-DIMER, QUANTITATIVE      Result Value Ref Range   D-Dimer, Quant 0.39  0.00 - 0.48 ug/mL-FEU   No results found.  MDM   Final diagnoses:  None   strongly doubt DVT. Low clinical suspicion.Wells dvt score negative 2. Negative d-dimer. Transient shortness of breath likely due to anxiety. Patient agrees. Plan Tylenol or Advil for pain. [facility]. Diagnosis left knee pain    Doug SouSam Orlanda Frankum, MD 08/06/13 1559  Doug SouSam Lavanna Rog, MD 08/06/13 [phone]

## 2013-12-29 ENCOUNTER — Other Ambulatory Visit: Payer: Self-pay | Admitting: Family Medicine

## 2013-12-29 DIAGNOSIS — E041 Nontoxic single thyroid nodule: Secondary | ICD-10-CM

## 2014-01-26 ENCOUNTER — Inpatient Hospital Stay: Admission: RE | Admit: 2014-01-26 | Payer: 59 | Source: Ambulatory Visit

## 2014-01-27 ENCOUNTER — Other Ambulatory Visit: Payer: 59

## 2014-03-13 ENCOUNTER — Encounter (HOSPITAL_COMMUNITY): Payer: Self-pay | Admitting: Emergency Medicine

## 2014-05-12 NOTE — L&D Delivery Note (Signed)
Delivery Note At 1:46 AM, on Sept 14, 2016, a viable female was delivered via Vaginal, Spontaneous Delivery (Presentation: Left Occiput Anterior with restitution to LOP).  Shoulders delivered easily and infant with good tone, but minimal grimace. Tactile stimulation given by provider and infant placed on mother's abdomen where nurse continued tactile stimulation. After about 2 minutes, infant appearing cyanotic and taken to warmer for nursing intervention.  Infant APGAR: 8,7;9. Cord clamped, cut, and blood collected. Placenta delivered spontaneously and noted to be intact with 3VC upon inspection.  Vaginal inspection revealed no lacerations.  Fundus firm, at the umbilicus, and bleeding small.  Mother hemodynamically stable and infant in warmer, but improving prior to provider exit.  Mother desires condom usage for birth control method and opts to breastfeed.  Infant weight at one hour of life: 6lbs 4.7oz  Anesthesia: Epidural  Episiotomy: None Lacerations: None Suture Repair: None Est. Blood Loss (mL): 125  Mom to postpartum.  Baby to Couplet care / Skin to Skin.  Calloway Andrus LYNN MSN, CNM 01/24/2015, 2:07 AM

## 2014-07-03 LAB — OB RESULTS CONSOLE ABO/RH: RH TYPE: POSITIVE

## 2014-07-07 LAB — OB RESULTS CONSOLE HEPATITIS B SURFACE ANTIGEN: Hepatitis B Surface Ag: NEGATIVE

## 2014-07-07 LAB — OB RESULTS CONSOLE RUBELLA ANTIBODY, IGM: RUBELLA: IMMUNE

## 2014-10-22 ENCOUNTER — Ambulatory Visit (INDEPENDENT_AMBULATORY_CARE_PROVIDER_SITE_OTHER): Payer: 59 | Admitting: Family Medicine

## 2014-10-22 ENCOUNTER — Ambulatory Visit (INDEPENDENT_AMBULATORY_CARE_PROVIDER_SITE_OTHER): Payer: 59

## 2014-10-22 VITALS — BP 112/64 | HR 78 | Temp 98.5°F | Resp 14 | Ht 63.5 in | Wt 201.4 lb

## 2014-10-22 DIAGNOSIS — S9031XA Contusion of right foot, initial encounter: Secondary | ICD-10-CM

## 2014-10-22 DIAGNOSIS — M79671 Pain in right foot: Secondary | ICD-10-CM

## 2014-10-22 NOTE — Progress Notes (Signed)
Patient ID: Anna Sexton, female   DOB: 22-Dec-1990, 24 y.o.   MRN: [id number]   This chart was scribed for Elvina Sidle, MD by [facility], medical scribe at [facility].The patient was seen in exam room 05 and the patient's care was started at 2:04 PM.  Patient ID: Anna Sexton MRN: [id number], DOB: 1990/11/17, 24 y.o. Date of Encounter: 10/22/2014  Primary Physician: Felix Pacini, DO  Chief Complaint:  Chief Complaint  Patient presents with  . Foot Pain    Right foot   HPI:  Anna Sexton is a 24 y.o. female who presents to [facility] complaining of a right foot injury, acute onset today. Pt was moving her daughter's high chair and it fell on her right foot. Pt is a Engineer, civil (consulting), at [employer]. She is 6.5 months pregnant and will be going on maternal leave in September.  Past Medical History  Diagnosis Date  . ATRIAL SEPTAL DEFECT, HX OF 06/11/2009  . CHLAMYDIAL INFECTION 05/28/2010  . THYROID NODULE 06/11/2009  . Urinary frequency 05/24/2010  . Heart murmur     ASD repair at 24 years of age  . Anemia     Home Meds: Prior to Admission medications   Medication Sig Start Date End Date Taking? Authorizing Provider  Prenatal Vit-Fe Fumarate-FA (PRENATAL MULTIVITAMIN) TABS Take 1 tablet by mouth daily at 12 noon.   Yes Historical Provider, MD  Ferrous Fumarate 324 MG TABS Take 1 tablet (106 mg of iron total) by mouth daily. Patient not taking: Reported on 10/22/2014 03/25/13   Dayarmys Piloto de Criselda Peaches, MD   Allergies: No Known Allergies  History   Social History  . Marital Status: Married    Spouse Name: N/A  . Number of Children: 0  . Years of Education: N/A   Occupational History  . Student (Nursing)    Social History Main Topics  . Smoking status: Never Smoker   . Smokeless tobacco: Never Used  . Alcohol Use: No  . Drug Use: No  . Sexual Activity: Yes   Other Topics Concern  . Not on file   Social History Narrative    Review of Systems: Constitutional: negative for chills, fever, night sweats, weight changes, or fatigue  HEENT: negative for vision changes, hearing loss, congestion, rhinorrhea, ST, epistaxis, or sinus pressure Cardiovascular: negative for chest pain or palpitations Respiratory: negative for hemoptysis, wheezing, shortness of breath, or cough Abdominal: negative for abdominal pain, nausea, vomiting, diarrhea, or constipation Dermatological: negative for rash Msk: Positive for arthralgias  Neurologic: negative for headache, dizziness, or syncope All other systems reviewed and are otherwise negative with the exception to those above and in the HPI.  Physical Exam: Blood pressure 112/64, pulse 78, temperature 98.5 F (36.9 C), temperature source Oral, resp. rate 14, height 5' 3.5" (1.613 m), weight 201 lb 6.4 oz (91.354 kg), SpO2 98 %., Body mass index is 35.11 kg/(m^2). General: Well developed, well nourished, in no acute distress. Head: Normocephalic, atraumatic, eyes without discharge, sclera non-icteric, nares are without discharge. Bilateral auditory canals clear, TM's are without perforation, pearly grey and translucent with reflective cone of light bilaterally. Oral cavity moist, posterior pharynx without exudate, erythema, peritonsillar abscess, or post nasal drip.  Neck: Supple. No thyromegaly. Full ROM. No lymphadenopathy. Lungs: Clear bilaterally to auscultation without wheezes, rales, or rhonchi. Breathing is unlabored. Heart: RRR with S1 S2. No murmurs, rubs, or gallops appreciated. Abdomen: Soft, non-tender, non-distended with normoactive  bowel sounds. No hepatomegaly. No rebound/guarding. No obvious abdominal masses. Msk:  Strength and tone normal for age. Extremities/Skin: Warm and dry. No clubbing or cyanosis. Dorsum of the right foot is tender ecchymotic and swollen. Neuro: Alert and oriented X 3. Moves all extremities spontaneously. Gait is normal. CNII-XII grossly in  tact. Psych:  Responds to questions appropriately with a normal affect.   [facility] reading (PRIMARY) by  Dr. Milus Glazier: Negative right foot.  ASSESSMENT AND PLAN:  24 y.o. year old female with contusion to her right foot. These are often quite painful and require elevation and rest along with ice.    Signed, Elvina Sidle, MD 10/22/2014 2:04 PM

## 2014-11-09 LAB — OB RESULTS CONSOLE HIV ANTIBODY (ROUTINE TESTING): HIV: NONREACTIVE

## 2014-11-09 LAB — OB RESULTS CONSOLE RPR: RPR: NONREACTIVE

## 2015-01-01 LAB — OB RESULTS CONSOLE GC/CHLAMYDIA
Chlamydia: NEGATIVE
GC PROBE AMP, GENITAL: NEGATIVE

## 2015-01-01 LAB — OB RESULTS CONSOLE GBS: GBS: POSITIVE

## 2015-01-17 ENCOUNTER — Ambulatory Visit (INDEPENDENT_AMBULATORY_CARE_PROVIDER_SITE_OTHER): Payer: 59 | Admitting: Physician Assistant

## 2015-01-17 VITALS — BP 126/70 | HR 73 | Temp 98.1°F | Resp 18 | Ht 63.75 in | Wt 214.4 lb

## 2015-01-17 DIAGNOSIS — B001 Herpesviral vesicular dermatitis: Secondary | ICD-10-CM

## 2015-01-17 MED ORDER — VALACYCLOVIR HCL 500 MG PO TABS: 500.0000 mg | ORAL_TABLET | Freq: Two times a day (BID) | ORAL | Status: DC

## 2015-01-17 NOTE — Patient Instructions (Signed)
Valacyclovir caplets  What is this medicine?  VALACYCLOVIR (val ay SYE kloe veer) is an antiviral medicine. It is used to treat or prevent infections caused by certain kinds of viruses. Examples of these infections include herpes and shingles. This medicine will not cure herpes.  This medicine may be used for other purposes; ask your health care provider or pharmacist if you have questions.  COMMON BRAND NAME(S): Valtrex  What should I tell my health care provider before I take this medicine?  They need to know if you have any of these conditions:  -acquired immunodeficiency syndrome (AIDS)  -any other condition that may weaken the immune system  -bone marrow or kidney transplant  -kidney disease  -an unusual or allergic reaction to valacyclovir, acyclovir, ganciclovir, valganciclovir, other medicines, foods, dyes, or preservatives  -pregnant or trying to get pregnant  -breast-feeding  How should I use this medicine?  Take this medicine by mouth with a glass of water. Follow the directions on the prescription label. You can take this medicine with or without food. Take your doses at regular intervals. Do not take your medicine more often than directed. Finish the full course prescribed by your doctor or health care professional even if you think your condition is better. Do not stop taking except on the advice of your doctor or health care professional.  Talk to your pediatrician regarding the use of this medicine in children. While this drug may be prescribed for children as young as 2 years for selected conditions, precautions do apply.  Overdosage: If you think you have taken too much of this medicine contact a poison control center or emergency room at once.  NOTE: This medicine is only for you. Do not share this medicine with others.  What if I miss a dose?  If you miss a dose, take it as soon as you can. If it is almost time for your next dose, take only that dose. Do not take double or extra doses.  What may  interact with this medicine?  -cimetidine  -probenecid  This list may not describe all possible interactions. Give your health care provider a list of all the medicines, herbs, non-prescription drugs, or dietary supplements you use. Also tell them if you smoke, drink alcohol, or use illegal drugs. Some items may interact with your medicine.  What should I watch for while using this medicine?  Tell your doctor or health care professional if your symptoms do not start to get better after 1 week.  This medicine works best when taken early in the course of an infection, within the first 72 hours. Begin treatment as soon as possible after the first signs of infection like tingling, itching, or pain in the affected area.  It is possible that genital herpes may still be spread even when you are not having symptoms. Always use safer sex practices like condoms made of latex or polyurethane whenever you have sexual contact.  You should stay well hydrated while taking this medicine. Drink plenty of fluids.  What side effects may I notice from receiving this medicine?  Side effects that you should report to your doctor or health care professional as soon as possible:  -allergic reactions like skin rash, itching or hives, swelling of the face, lips, or tongue  -aggressive behavior  -confusion  -hallucinations  -problems with balance, talking, walking  -stomach pain  -tremor  -trouble passing urine or change in the amount of urine  Side effects that usually do not   require medical attention (report to your doctor or health care professional if they continue or are bothersome):  -dizziness  -headache  -nausea, vomiting  This list may not describe all possible side effects. Call your doctor for medical advice about side effects. You may report side effects to FDA at 1-800-FDA-1088.  Where should I keep my medicine?  Keep out of the reach of children.  Store at room temperature between 15 and 25 degrees C (59 and 77 degrees F). Keep  container tightly closed. Throw away any unused medicine after the expiration date.  NOTE: This sheet is a summary. It may not cover all possible information. If you have questions about this medicine, talk to your doctor, pharmacist, or health care provider.  © 2015, [employer]vier/Gold Standard. (2012-04-13 16:34:05)

## 2015-01-17 NOTE — Progress Notes (Signed)
   Subjective:    Patient ID: Anna Sexton, female    DOB: 1991-03-27, 24 y.o.   MRN: [id number]  HPI Patient of [redacted] week gestation with second daughter for refill of valacyclovir 50 mg BID for cold sore on nose. States that she has taken medication for several years without side effect. Started having tingling pain on nose today and wanted to start medication before outbreak occurred. Denies fever or URI sx. NKDA.   Review of Systems  Constitutional: Positive for fatigue. Negative for fever and chills.  HENT: Negative.   Eyes: Negative.   Gastrointestinal: Negative for nausea and vomiting.  Skin: Negative for color change, rash and wound.  Neurological: Negative for headaches.       Objective:   Physical Exam  Constitutional: She is oriented to person, place, and time. She appears well-developed and well-nourished. No distress.  Blood pressure 126/70, pulse 73, temperature 98.1 F (36.7 C), temperature source Oral, resp. rate 18, height 5' 3.75" (1.619 m), weight 214 lb 6 oz (97.24 kg), SpO2 99 %.  HENT:  Head: Normocephalic and atraumatic.  Right Ear: External ear normal.  Left Ear: External ear normal.  Nose: No rhinorrhea or nasal deformity.  Eyes: Conjunctivae are normal. Right eye exhibits no discharge. Left eye exhibits no discharge. No scleral icterus.  Pulmonary/Chest: Effort normal.  Neurological: She is alert and oriented to person, place, and time.  Skin: No rash noted. She is not diaphoretic. No erythema. No pallor.  Psychiatric: She has a normal mood and affect. Her behavior is normal. Judgment and thought content normal.       Assessment & Plan:  1. Cold sore No apparent lesion yet. No risk of teratogenicity of baby.  - valACYclovir (VALTREX) 500 MG tablet; Take 1 tablet (500 mg total) by mouth 2 (two) times daily.  Dispense: 30 tablet; Refill: 1   Anna Lalli PA-C  [facility] 01/17/2015 7:42 PM

## 2015-01-23 ENCOUNTER — Encounter (HOSPITAL_COMMUNITY): Payer: Self-pay | Admitting: *Deleted

## 2015-01-23 ENCOUNTER — Inpatient Hospital Stay (HOSPITAL_COMMUNITY)
Admission: AD | Admit: 2015-01-23 | Discharge: 2015-01-25 | DRG: 775 | Disposition: A | Discharge status: Home / Self Care | Payer: 59 | Source: Ambulatory Visit | Attending: Obstetrics and Gynecology | Admitting: Obstetrics and Gynecology

## 2015-01-23 ENCOUNTER — Inpatient Hospital Stay (HOSPITAL_COMMUNITY): Payer: 59 | Admitting: Anesthesiology

## 2015-01-23 DIAGNOSIS — O133 Gestational [pregnancy-induced] hypertension without significant proteinuria, third trimester: Principal | ICD-10-CM | POA: Diagnosis present

## 2015-01-23 DIAGNOSIS — Z8249 Family history of ischemic heart disease and other diseases of the circulatory system: Secondary | ICD-10-CM

## 2015-01-23 DIAGNOSIS — Z823 Family history of stroke: Secondary | ICD-10-CM | POA: Diagnosis not present

## 2015-01-23 DIAGNOSIS — O99824 Streptococcus B carrier state complicating childbirth: Secondary | ICD-10-CM | POA: Diagnosis present

## 2015-01-23 DIAGNOSIS — Z833 Family history of diabetes mellitus: Secondary | ICD-10-CM

## 2015-01-23 DIAGNOSIS — Z3A39 39 weeks gestation of pregnancy: Secondary | ICD-10-CM | POA: Diagnosis present

## 2015-01-23 DIAGNOSIS — B951 Streptococcus, group B, as the cause of diseases classified elsewhere: Secondary | ICD-10-CM | POA: Diagnosis present

## 2015-01-23 DIAGNOSIS — Z822 Family history of deafness and hearing loss: Secondary | ICD-10-CM | POA: Diagnosis not present

## 2015-01-23 DIAGNOSIS — R030 Elevated blood-pressure reading, without diagnosis of hypertension: Secondary | ICD-10-CM | POA: Diagnosis present

## 2015-01-23 LAB — COMPREHENSIVE METABOLIC PANEL
ALT: 53 U/L (ref 14–54)
ANION GAP: 12 (ref 5–15)
AST: 38 U/L (ref 15–41)
Albumin: 3.2 g/dL — ABNORMAL LOW (ref 3.5–5.0)
Alkaline Phosphatase: 146 U/L — ABNORMAL HIGH (ref 38–126)
BUN: 9 mg/dL (ref 6–20)
CALCIUM: 8.7 mg/dL — AB (ref 8.9–10.3)
CO2: 20 mmol/L — AB (ref 22–32)
Chloride: 102 mmol/L (ref 101–111)
Creatinine, Ser: 0.47 mg/dL (ref 0.44–1.00)
GFR calc Af Amer: >60 mL/min (ref >60)
Glucose, Bld: 93 mg/dL (ref 65–99)
Potassium: 3.6 mmol/L (ref 3.5–5.1)
SODIUM: 134 mmol/L — AB (ref 135–145)
Total Bilirubin: 0.3 mg/dL (ref 0.3–1.2)
Total Protein: 6.9 g/dL (ref 6.5–8.1)

## 2015-01-23 LAB — URINALYSIS, ROUTINE W REFLEX MICROSCOPIC
BILIRUBIN URINE: NEGATIVE
Glucose, UA: NEGATIVE mg/dL
Ketones, ur: NEGATIVE mg/dL
Nitrite: NEGATIVE
PROTEIN: NEGATIVE mg/dL
Specific Gravity, Urine: <1.005 — ABNORMAL LOW (ref 1.005–1.030)
UROBILINOGEN UA: 0.2 mg/dL (ref 0.0–1.0)
pH: 6.5 (ref 5.0–8.0)

## 2015-01-23 LAB — URINE MICROSCOPIC-ADD ON

## 2015-01-23 LAB — PROTEIN / CREATININE RATIO, URINE
CREATININE, URINE: 26 mg/dL
Total Protein, Urine: <6 mg/dL

## 2015-01-23 LAB — CBC WITH DIFFERENTIAL/PLATELET
Basophils Absolute: 0 10*3/uL (ref 0.0–0.1)
Basophils Relative: 0 % (ref 0–1)
EOS PCT: 2 % (ref 0–5)
Eosinophils Absolute: 0.1 10*3/uL (ref 0.0–0.7)
HCT: 33.7 % — ABNORMAL LOW (ref 36.0–46.0)
HEMOGLOBIN: 11.1 g/dL — AB (ref 12.0–15.0)
LYMPHS ABS: 1.7 10*3/uL (ref 0.7–4.0)
Lymphocytes Relative: 20 % (ref 12–46)
MCH: 27.3 pg (ref 26.0–34.0)
MCHC: 32.9 g/dL (ref 30.0–36.0)
MCV: 82.8 fL (ref 78.0–100.0)
MONOS PCT: 12 % (ref 3–12)
Monocytes Absolute: 1 10*3/uL (ref 0.1–1.0)
Neutro Abs: 5.4 10*3/uL (ref 1.7–7.7)
Neutrophils Relative %: 66 % (ref 43–77)
PLATELETS: 256 10*3/uL (ref 150–400)
RBC: 4.07 MIL/uL (ref 3.87–5.11)
RDW: 13.9 % (ref 11.5–15.5)
WBC: 8.2 10*3/uL (ref 4.0–10.5)

## 2015-01-23 LAB — ABO/RH: ABO/RH(D): B POS

## 2015-01-23 LAB — CBC
HEMATOCRIT: 32.3 % — AB (ref 36.0–46.0)
HEMOGLOBIN: 10.7 g/dL — AB (ref 12.0–15.0)
MCH: 27.4 pg (ref 26.0–34.0)
MCHC: 33.1 g/dL (ref 30.0–36.0)
MCV: 82.6 fL (ref 78.0–100.0)
Platelets: 243 10*3/uL (ref 150–400)
RBC: 3.91 MIL/uL (ref 3.87–5.11)
RDW: 14 % (ref 11.5–15.5)
WBC: 8.4 10*3/uL (ref 4.0–10.5)

## 2015-01-23 LAB — TYPE AND SCREEN
ABO/RH(D): B POS
Antibody Screen: NEGATIVE

## 2015-01-23 LAB — LACTATE DEHYDROGENASE: LDH: 167 U/L (ref 98–192)

## 2015-01-23 LAB — URIC ACID: Uric Acid, Serum: 4.3 mg/dL (ref 2.3–6.6)

## 2015-01-23 MED ORDER — FLEET ENEMA 7-19 GM/118ML RE ENEM: 1.0000 | ENEMA | RECTAL | Status: DC | PRN

## 2015-01-23 MED ORDER — LACTATED RINGERS IV SOLN
INTRAVENOUS | Status: DC
  Administered 2015-01-23 (×3): via INTRAVENOUS

## 2015-01-23 MED ORDER — OXYTOCIN 40 UNITS IN LACTATED RINGERS INFUSION - SIMPLE MED: 62.5000 mL/h | INTRAVENOUS | Status: DC

## 2015-01-23 MED ORDER — OXYCODONE-ACETAMINOPHEN 5-325 MG PO TABS: 1.0000 | ORAL_TABLET | ORAL | Status: DC | PRN

## 2015-01-23 MED ORDER — ZOLPIDEM TARTRATE 5 MG PO TABS: 5.0000 mg | ORAL_TABLET | Freq: Every evening | ORAL | Status: DC | PRN

## 2015-01-23 MED ORDER — DIPHENHYDRAMINE HCL 50 MG/ML IJ SOLN
12.5000 mg | INTRAMUSCULAR | Status: DC | PRN
  Administered 2015-01-23 – 2015-01-24 (×2): 12.5 mg via INTRAVENOUS
  Filled 2015-01-23 (×2): qty 1

## 2015-01-23 MED ORDER — OXYTOCIN 40 UNITS IN LACTATED RINGERS INFUSION - SIMPLE MED
1.0000 m[IU]/min | INTRAVENOUS | Status: DC
  Administered 2015-01-23: 1 m[IU]/min via INTRAVENOUS

## 2015-01-23 MED ORDER — OXYTOCIN BOLUS FROM INFUSION
500.0000 mL | INTRAVENOUS | Status: DC
  Administered 2015-01-24: 500 mL via INTRAVENOUS

## 2015-01-23 MED ORDER — HYDRALAZINE HCL 20 MG/ML IJ SOLN: 10.0000 mg | Freq: Once | INTRAMUSCULAR | Status: DC | PRN

## 2015-01-23 MED ORDER — ACETAMINOPHEN 325 MG PO TABS: 650.0000 mg | ORAL_TABLET | ORAL | Status: DC | PRN

## 2015-01-23 MED ORDER — ONDANSETRON HCL 4 MG/2ML IJ SOLN
4.0000 mg | Freq: Four times a day (QID) | INTRAMUSCULAR | Status: DC | PRN
  Administered 2015-01-23: 4 mg via INTRAVENOUS
  Filled 2015-01-23: qty 2

## 2015-01-23 MED ORDER — PHENYLEPHRINE 40 MCG/ML (10ML) SYRINGE FOR IV PUSH (FOR BLOOD PRESSURE SUPPORT)
PREFILLED_SYRINGE | INTRAVENOUS | Status: AC
  Filled 2015-01-23: qty 20

## 2015-01-23 MED ORDER — FENTANYL 2.5 MCG/ML BUPIVACAINE 1/10 % EPIDURAL INFUSION (WH - ANES)
14.0000 mL/h | INTRAMUSCULAR | Status: DC | PRN
  Administered 2015-01-23: 14 mL/h via EPIDURAL

## 2015-01-23 MED ORDER — TERBUTALINE SULFATE 1 MG/ML IJ SOLN
0.2500 mg | Freq: Once | INTRAMUSCULAR | Status: DC | PRN
  Filled 2015-01-23: qty 1

## 2015-01-23 MED ORDER — FENTANYL CITRATE (PF) 100 MCG/2ML IJ SOLN: 100.0000 ug | INTRAMUSCULAR | Status: DC | PRN

## 2015-01-23 MED ORDER — LABETALOL HCL 5 MG/ML IV SOLN: 20.0000 mg | INTRAVENOUS | Status: DC | PRN

## 2015-01-23 MED ORDER — LIDOCAINE HCL (PF) 1 % IJ SOLN
30.0000 mL | INTRAMUSCULAR | Status: DC | PRN
  Filled 2015-01-23: qty 30

## 2015-01-23 MED ORDER — OXYCODONE-ACETAMINOPHEN 5-325 MG PO TABS: 2.0000 | ORAL_TABLET | ORAL | Status: DC | PRN

## 2015-01-23 MED ORDER — PHENYLEPHRINE 40 MCG/ML (10ML) SYRINGE FOR IV PUSH (FOR BLOOD PRESSURE SUPPORT)
80.0000 ug | PREFILLED_SYRINGE | INTRAVENOUS | Status: DC | PRN
  Filled 2015-01-23: qty 2

## 2015-01-23 MED ORDER — EPHEDRINE 5 MG/ML INJ
10.0000 mg | INTRAVENOUS | Status: DC | PRN
Start: 2015-01-23 — End: 2015-01-24
  Filled 2015-01-23: qty 2

## 2015-01-23 MED ORDER — FENTANYL 2.5 MCG/ML BUPIVACAINE 1/10 % EPIDURAL INFUSION (WH - ANES)
INTRAMUSCULAR | Status: AC
  Filled 2015-01-23: qty 125

## 2015-01-23 MED ORDER — CITRIC ACID-SODIUM CITRATE 334-500 MG/5ML PO SOLN: 30.0000 mL | ORAL | Status: DC | PRN

## 2015-01-23 MED ORDER — LACTATED RINGERS IV SOLN
500.0000 mL | INTRAVENOUS | Status: DC | PRN
  Administered 2015-01-23: 500 mL via INTRAVENOUS

## 2015-01-23 MED ORDER — BUTALBITAL-APAP-CAFFEINE 50-325-40 MG PO TABS
1.0000 | ORAL_TABLET | ORAL | Status: AC
  Administered 2015-01-23: 1 via ORAL
  Filled 2015-01-23: qty 1

## 2015-01-23 MED ORDER — PENICILLIN G POTASSIUM 5000000 UNITS IJ SOLR
2.5000 10*6.[IU] | INTRAMUSCULAR | Status: DC
  Administered 2015-01-23 (×2): 2.5 10*6.[IU] via INTRAVENOUS
  Filled 2015-01-23 (×7): qty 2.5

## 2015-01-23 MED ORDER — PENICILLIN G POTASSIUM 5000000 UNITS IJ SOLR
5.0000 10*6.[IU] | Freq: Once | INTRAVENOUS | Status: AC
  Administered 2015-01-23: 5 10*6.[IU] via INTRAVENOUS
  Filled 2015-01-23: qty 5

## 2015-01-23 MED ORDER — LIDOCAINE HCL (PF) 1 % IJ SOLN
INTRAMUSCULAR | Status: DC | PRN
  Administered 2015-01-23 (×2): 8 mL via EPIDURAL

## 2015-01-23 NOTE — Progress Notes (Addendum)
  Subjective: Uncomfortable with contractions. Husband at beside.   Objective: BP 117/62 mmHg  Pulse 71  Temp(Src) 98 F (36.7 C) (Oral)  Resp 16  Ht 5' 3.75" (1.619 m)  Wt 97.07 kg (214 lb)  BMI 37.03 kg/m2  SpO2 99%      Filed Vitals:   01/23/15 1700 01/23/15 1730 01/23/15 1800 01/23/15 1830  BP: 123/82 116/70 128/78 117/62  Pulse: 71 71 73 71  Temp:      TempSrc:      Resp: Height:      Weight:      SpO2:       CBC Latest Ref Rng 01/23/2015 01/23/2015 05/15/2013  WBC 4.0 - 10.5 K/uL 8.4 8.2 11.5(H)  Hemoglobin 12.0 - 15.0 g/dL 10.7(L) 11.1(L) 13.3  Hematocrit 36.0 - 46.0 % 32.3(L) 33.7(L) 38.6  Platelets 150 - 400 K/uL 243 256 244      FHT: Category 1  UC:   irregular, every 2-4 minutes SVE:   Dilation: 4 Effacement (%): 80 Station: -1 Exam by:: V Kaymon Denomme CNM Pitocin at 11 mu/min   Assessment:  IOL Gestational hypertension GBS+  Plan: Continue Pitocin  Pt desires epidural now   Nigel Bridgeman CNM 01/23/2015, 6:44 PM

## 2015-01-23 NOTE — Anesthesia Preprocedure Evaluation (Signed)
Anesthesia Evaluation  Patient identified by MRN, date of birth, ID band Patient awake    Reviewed: Allergy & Precautions, H&P , NPO status , Patient's Chart, lab work & pertinent test results  Airway Mallampati: II  TM Distance: >3 FB Neck ROM: full    Dental no notable dental hx.    Pulmonary neg pulmonary ROS,    Pulmonary exam normal        Cardiovascular negative cardio ROS Normal cardiovascular exam     Neuro/Psych negative psych ROS   GI/Hepatic negative GI ROS, Neg liver ROS,   Endo/Other  negative endocrine ROS  Renal/GU negative Renal ROS     Musculoskeletal   Abdominal (+) + obese,   Peds  Hematology   Anesthesia Other Findings   Reproductive/Obstetrics (+) Pregnancy                             Anesthesia Physical Anesthesia Plan  ASA: II  Anesthesia Plan: Epidural   Post-op Pain Management:    Induction:   Airway Management Planned:   Additional Equipment:   Intra-op Plan:   Post-operative Plan:   Informed Consent: I have reviewed the patients History and Physical, chart, labs and discussed the procedure including the risks, benefits and alternatives for the proposed anesthesia with the patient or authorized representative who has indicated his/her understanding and acceptance.     Plan Discussed with:   Anesthesia Plan Comments:         Anesthesia Quick Evaluation  

## 2015-01-23 NOTE — Progress Notes (Signed)
Anna Sexton MRN: [id number]  Subjective: -Care assumed of 24 y.o. G2P1 at [redacted]w[redacted]d who presents for induction of labor secondary to [facility].  Patient reports comfort after epidural placement.  Patient denies HA, visual disturbances, epigastric pain, and SOB.  Patient also denies LoF and VB.  Patient is GBS positive and currently receiving pitocin.   Objective: BP 139/84 mmHg  Pulse 75  Temp(Src) 98 F (36.7 C) (Oral)  Resp 18  Ht 5' 3.75" (1.619 m)  Wt 97.07 kg (214 lb)  BMI 37.03 kg/m2  SpO2 100%     FHT: 125 bpm, Mod Var, -Decels, +Accels UC: Q1-54min, palpates mild to moderate   SVE:   Dilation: 4 Effacement (%): 80 Station: -1 Exam by:: Manfred Arch CNM at 1840 Membranes:Intact Pitocin:74mUn/min  Assessment:  IUP at 39wks Cat I FT  GHTN GBS Positive Pitocin Induction  Plan: -Discussed POC to include +Vaginal exam at 2300 unless necessary earlier +Continue PCN as scheduled -Patient and FOB, Will, without questions or concerns -Continue other mgmt as ordered  Anna Floyd LYNN,MSN, CNM 01/23/2015, 8:06 PM

## 2015-01-23 NOTE — MAU Note (Signed)
Pt reports a headache for the last 12 hours, B/P at work 158/91

## 2015-01-23 NOTE — Progress Notes (Signed)
  Subjective: Now in [facility] suite. Occasional contractions. Reports persistent mild HA, denies epigastric pain or vision changes.   Objective: BP 116/61 mmHg  Pulse 73  Temp(Src) 98 F (36.7 C) (Oral)  Resp 16  Ht 5' 3.75" (1.619 m)  Wt 97.07 kg (214 lb)  BMI 37.03 kg/m2  SpO2 99%     Filed Vitals:   01/23/15 0921 01/23/15 1110 01/23/15 1158 01/23/15 1256  BP: 132/78 133/84 140/78 116/61  Pulse: 85 82 82 73  Temp:  98 F (36.7 C)    TempSrc:  Oral    Resp:  16  16  Height:      Weight:      SpO2:       FHT: Category 1   UC:   Irregular/occasional SVE:   Dilation: 3 Effacement (%): 80 Station: -1 Exam by:: VEmilee Hero cnm Cx posterior but very soft   Assessment:  IUP at 39 0/[redacted] weeks Gestational hypertension  GBS positive Favorable cervix  Plan: Induction of labor Reviewed use of Pitocin in labor  Pt desires induction and agreeable with plan Plans epidural   Anna Sexton CNM 01/23/2015, 2:16 PM

## 2015-01-23 NOTE — Progress Notes (Addendum)
Anna Sexton MRN: [id number]  Subjective: -Patient resting in bed.  Continues to report comfort with epidural.    Objective: BP 139/84 mmHg  Pulse 75  Temp(Src) 98 F (36.7 C) (Oral)  Resp 18  Ht 5' 3.75" (1.619 m)  Wt 97.07 kg (214 lb)  BMI 37.03 kg/m2  SpO2 100%     FHT: 120 bpm, Mod Var, -Decels, +Accels UC: Q1-52min, palpates mild to moderate   SVE:   Dilation: 5 Effacement (%): 80 Station: -1 Exam by:: J.Hazelyn Kallen, CNM Membranes:AROM at 2226 IUPC inserted Pitocin:49mUn/min  Assessment:  IUP at 39wks Cat I FT  Amniotomy GBS Positive  Plan: -Discussed R/B of Amniotomy and IUPC insertion including infection, cord prolapse, decreased labor time, accurate measurement of contractions -Questions or concerns addressed -Continue other mgmt as ordered -Dr. Kathie Rhodes. Rivard updated on patient status  Anna Griffith LYNN,MSN, CNM 01/23/2015, 10:33 PM   Addendum: Ctx every minute with coupling noted Decrease pitocin to 68mUn/min Continue other mgmt as ordered Update as appropriate  Anna Sexton 11:11 PM

## 2015-01-23 NOTE — Progress Notes (Signed)
Monitors off for epidural placement.  Category I tracing. 

## 2015-01-23 NOTE — H&P (Signed)
Anna Sexton is a 24 y.o. female, G2P1001 at 34 weeks, presenting for HA and elevated BP noted at work during the night.  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  Had onset of frontal HA prior to coming in to work at 7p, then it worsened during night despite Tylenol.  She took BP around 5:30am, and it was 140/90.  She presented to MAU for evaluation.  In MAU, BPs initially were 140-150s/90s, then settled down to 140s/mid-high 80s.  Still noting moderate frontal HA--received Fioircet in MAU with some benefit.  PIH labs and PCR WNL.  Per consult with Dr. Normand Sloop, will admit for induction due to gestational hypertension.  Patient had last cardiac evaluation in 2012, with Dr. Marlou Starks testing, "no further testing required".  No issues with last pregnancy.  Patient Active Problem List   Diagnosis Date Noted  . Normal labor 01/23/2015  . Positive GBS test 01/23/2015  . Supervision of normal first pregnancy 10/11/2012  . Headache disorder 10/15/2011  . THYROID NODULE 06/11/2009  . ATRIAL SEPTAL DEFECT, HX OF 06/11/2009    History of present pregnancy: Patient entered care at 10 2/7 weeks.   EDC of 01/30/15 was established by LMP and [place] at 9 6/7 weeks.   Anatomy scan:  20 1/7 weeks, with EFW 14%ile, with normal findings and an posterior placenta.   Additional [place] evaluations:   37 weeks:  EFW 6+5, 33%ile, normal fluid, vtx Significant prenatal events:  Some issues with early nausea, used Diclegis with benefit.  Indicated preference for female providers, but would accept female in event of emergency.  Received TDAP 11/23/14. No issues with BP during pregnancy. Last evaluation:  01/17/15--cervix 1 cm, 30%, vtx, -2, BP 100/70.  OB History    Gravida Para Term Preterm AB TAB SAB Ectopic Multiple Living   2 1 1       1     2015--SVB, 39 weeks, 7 hour labor, 6+15, female, [facility] by [facility].  Past Medical History  Diagnosis Date  . ATRIAL SEPTAL DEFECT, HX OF 06/11/2009  .  CHLAMYDIAL INFECTION 05/28/2010  . THYROID NODULE 06/11/2009  . Urinary frequency 05/24/2010  . Heart murmur     ASD repair at 24 years of age  . Anemia    Past Surgical History  Procedure Laterality Date  . Asd repair      2008  . Cardiac catheterization  10/2007    for ASD repair   Family History: family history includes Arthritis in her mother; Depression in her maternal grandmother; Diabetes in her father; Diabetes insipidus in her father; Hearing loss in her maternal grandmother; Heart disease in her father; Hyperlipidemia in her father; Hypertension in her father; Stroke in her father.   Social History:  reports that she has never smoked. She has never used smokeless tobacco. She reports that she does not drink alcohol or use illicit drugs.  Patient is Tree surgeon, college educated, employed as Designer, industrial/product at [employer], married to [place], who is involved and supportive, and a Engineer, civil (consulting), as well.     Prenatal Transfer Tool  Maternal Diabetes: No Genetic Screening: Declined Maternal Ultrasounds/Referrals: Normal Fetal Ultrasounds or other Referrals:  None Maternal Substance Abuse:  No Significant Maternal Medications:  None Significant Maternal Lab Results: Lab values include: Group B Strep positive  TDAP 11/23/14 Flu 2015  ROS:  Frontal HA, occasional UCs, +FM  No Known Allergies  BP 130-150s/80s, other VSS.  Chest clear Heart RRR  without murmur Abd gravid, NT, FH 36 cm Pelvic: Cervix very posterior, 1 cm, 60%, vtx, -1. Ext: DTR 1+, no clonus, 1+ edema  FHR: Category 1 UCs:  Occasional  Prenatal labs: ABO, Rh: --/--/B POS (09/13 1115) Antibody: NEG (09/13 1115) Rubella:   Immune RPR:   NR HBsAg:   Neg HIV:   NR GBS:  Positive 01/02/15 Sickle cell/Hgb electrophoresis:  AA from previous pregnancy Pap:  WNL 2014 GC:  Negative 07/03/14 and 01/01/15 Chlamydia:  Negative 07/03/14 and 01/01/15 Genetic screenings: Declined Glucola:  WNL Other:   Hgb 13.2 at NOB, 11.4 at 28  weeks 80K enterococcus at NOB 45K Ecoli 07/24/14 Negative urine culture 09/13/14  Results for orders placed or performed during the hospital encounter of 01/23/15 (from the past 24 hour(s))  Urinalysis, Routine w reflex microscopic (not at [facility])     Status: Abnormal   Collection Time: 01/23/15  5:50 AM  Result Value Ref Range   Color, Urine YELLOW YELLOW   APPearance CLEAR CLEAR   Specific Gravity, Urine <1.005 (L) 1.005 - 1.030   pH 6.5 5.0 - 8.0   Glucose, UA NEGATIVE NEGATIVE mg/dL   Hgb urine dipstick TRACE (A) NEGATIVE   Bilirubin Urine NEGATIVE NEGATIVE   Ketones, ur NEGATIVE NEGATIVE mg/dL   Protein, ur NEGATIVE NEGATIVE mg/dL   Urobilinogen, UA 0.2 0.0 - 1.0 mg/dL   Nitrite NEGATIVE NEGATIVE   Leukocytes, UA MODERATE (A) NEGATIVE  Urine microscopic-add on     Status: Abnormal   Collection Time: 01/23/15  5:50 AM  Result Value Ref Range   Squamous Epithelial / LPF FEW (A) RARE   WBC, UA 3-6 <3 WBC/hpf   Bacteria, UA RARE RARE  Protein / creatinine ratio, urine     Status: None   Collection Time: 01/23/15  5:50 AM  Result Value Ref Range   Creatinine, Urine 26.00 mg/dL   Total Protein, Urine <6 mg/dL   Protein Creatinine Ratio        0.00 - 0.15 mg/mg[Cre]  CBC with Differential/Platelet     Status: Abnormal   Collection Time: 01/23/15  7:00 AM  Result Value Ref Range   WBC 8.2 4.0 - 10.5 K/uL   RBC 4.07 3.87 - 5.11 MIL/uL   Hemoglobin 11.1 (L) 12.0 - 15.0 g/dL   HCT 09.8 (L) 11.9 - 14.7 %   MCV 82.8 78.0 - 100.0 fL   MCH 27.3 26.0 - 34.0 pg   MCHC 32.9 30.0 - 36.0 g/dL   RDW 82.9 56.2 - 13.0 %   Platelets 256 150 - 400 K/uL   Neutrophils Relative % 66 43 - 77 %   Neutro Abs 5.4 1.7 - 7.7 K/uL   Lymphocytes Relative 20 12 - 46 %   Lymphs Abs 1.7 0.7 - 4.0 K/uL   Monocytes Relative 12 3 - 12 %   Monocytes Absolute 1.0 0.1 - 1.0 K/uL   Eosinophils Relative 2 0 - 5 %   Eosinophils Absolute 0.1 0.0 - 0.7 K/uL   Basophils Relative 0 0 - 1 %   Basophils Absolute 0.0  0.0 - 0.1 K/uL  Comprehensive metabolic panel     Status: Abnormal   Collection Time: 01/23/15  7:00 AM  Result Value Ref Range   Sodium 134 (L) 135 - 145 mmol/L   Potassium 3.6 3.5 - 5.1 mmol/L   Chloride 102 101 - 111 mmol/L   CO2 20 (L) 22 - 32 mmol/L   Glucose, Bld 93 65 - 99  mg/dL   BUN 9 6 - 20 mg/dL   Creatinine, Ser 1.61 0.44 - 1.00 mg/dL   Calcium 8.7 (L) 8.9 - 10.3 mg/dL   Total Protein 6.9 6.5 - 8.1 g/dL   Albumin 3.2 (L) 3.5 - 5.0 g/dL   AST 38 15 - 41 U/L   ALT 53 14 - 54 U/L   Alkaline Phosphatase 146 (H) 38 - 126 U/L   Total Bilirubin 0.3 0.3 - 1.2 mg/dL   GFR calc non Af Amer >60 >60 mL/min   GFR calc Af Amer >60 >60 mL/min   Anion gap 12 5 - 15  Lactate dehydrogenase     Status: None   Collection Time: 01/23/15  7:00 AM  Result Value Ref Range   LDH 167 98 - 192 U/L  Uric acid     Status: None   Collection Time: 01/23/15  7:00 AM  Result Value Ref Range   Uric Acid, Serum 4.3 2.3 - 6.6 mg/dL  Type and screen     Status: None   Collection Time: 01/23/15 11:15 AM  Result Value Ref Range   ABO/RH(D) B POS    Antibody Screen NEG    Sample Expiration 01/26/2015   CBC     Status: Abnormal   Collection Time: 01/23/15  2:40 PM  Result Value Ref Range   WBC 8.4 4.0 - 10.5 K/uL   RBC 3.91 3.87 - 5.11 MIL/uL   Hemoglobin 10.7 (L) 12.0 - 15.0 g/dL   HCT 09.6 (L) 04.5 - 40.9 %   MCV 82.6 78.0 - 100.0 fL   MCH 27.4 26.0 - 34.0 pg   MCHC 33.1 30.0 - 36.0 g/dL   RDW 81.1 91.4 - 78.2 %   Platelets 243 150 - 400 K/uL    Assessment/Plan: IUP at 39.0 Mildly elevated BP with HA GBS positive Plans epidural Hx ASD repair 2008--no issues with previous pregnancy   Plan: Admit to Birthing Suite per consult with Dr. Normand Sloop for induction due to mild gestational hypertension and HA. Routine [facility] orders Pain med/epidural prn PCN G for GBS prophylaxis Repeat CBC PRN prior to epidural IV anti-hypertensives for BP parameters  Currently awaiting bed in L&D, will  re-evaluate Cx after transfer to L&D Will evaluate for Cytotec vs pitocni if no advancement of labor  Discussed R&B of induction with patient--she wishes to proceed with induction.  Nyra Capes, [place] 01/23/2015, 10:21a

## 2015-01-23 NOTE — Anesthesia Procedure Notes (Signed)
Epidural Patient location during procedure: OB Start time: 01/23/2015 7:20 PM End time: 01/23/2015 7:24 PM  Staffing Anesthesiologist: Leilani Able Performed by: anesthesiologist   Preanesthetic Checklist Completed: patient identified, surgical consent, pre-op evaluation, timeout performed, IV checked, risks and benefits discussed and monitors and equipment checked  Epidural Patient position: sitting Prep: site prepped and draped and DuraPrep Patient monitoring: continuous pulse ox and blood pressure Approach: midline Location: L3-L4 Injection technique: LOR air  Needle:  Needle type: Tuohy  Needle gauge: 17 G Needle length: 9 cm and 9 Needle insertion depth: 6 cm Catheter type: closed end flexible Catheter size: 19 Gauge Catheter at skin depth: 11 cm Test dose: negative and Other  Assessment Sensory level: T9 Events: blood not aspirated, injection not painful, no injection resistance, negative IV test and no paresthesia  Additional Notes Reason for block:procedure for pain

## 2015-01-24 ENCOUNTER — Encounter (HOSPITAL_COMMUNITY): Payer: Self-pay

## 2015-01-24 LAB — CBC
HEMATOCRIT: 31.6 % — AB (ref 36.0–46.0)
Hemoglobin: 10.3 g/dL — ABNORMAL LOW (ref 12.0–15.0)
MCH: 27.2 pg (ref 26.0–34.0)
MCHC: 32.6 g/dL (ref 30.0–36.0)
MCV: 83.4 fL (ref 78.0–100.0)
PLATELETS: 234 10*3/uL (ref 150–400)
RBC: 3.79 MIL/uL — AB (ref 3.87–5.11)
RDW: 14 % (ref 11.5–15.5)
WBC: 13.6 10*3/uL — AB (ref 4.0–10.5)

## 2015-01-24 LAB — SYPHILIS: RPR W/REFLEX TO RPR TITER AND TREPONEMAL ANTIBODIES, TRADITIONAL SCREENING AND DIAGNOSIS ALGORITHM: RPR Ser Ql: NONREACTIVE

## 2015-01-24 LAB — HIV ANTIBODY (ROUTINE TESTING W REFLEX): HIV Screen 4th Generation wRfx: NONREACTIVE

## 2015-01-24 MED ORDER — ONDANSETRON HCL 4 MG PO TABS: 4.0000 mg | ORAL_TABLET | ORAL | Status: DC | PRN

## 2015-01-24 MED ORDER — SENNOSIDES-DOCUSATE SODIUM 8.6-50 MG PO TABS
2.0000 | ORAL_TABLET | ORAL | Status: DC
  Administered 2015-01-25: 2 via ORAL
  Filled 2015-01-24: qty 2

## 2015-01-24 MED ORDER — ACETAMINOPHEN 325 MG PO TABS
650.0000 mg | ORAL_TABLET | ORAL | Status: DC | PRN
  Administered 2015-01-24 (×2): 650 mg via ORAL
  Filled 2015-01-24 (×2): qty 2

## 2015-01-24 MED ORDER — PRENATAL MULTIVITAMIN CH
1.0000 | ORAL_TABLET | Freq: Every day | ORAL | Status: DC
  Administered 2015-01-24: 1 via ORAL
  Filled 2015-01-24: qty 1

## 2015-01-24 MED ORDER — DIPHENHYDRAMINE HCL 25 MG PO CAPS: 25.0000 mg | ORAL_CAPSULE | Freq: Four times a day (QID) | ORAL | Status: DC | PRN

## 2015-01-24 MED ORDER — OXYCODONE-ACETAMINOPHEN 5-325 MG PO TABS: 2.0000 | ORAL_TABLET | ORAL | Status: DC | PRN

## 2015-01-24 MED ORDER — INFLUENZA VAC SPLIT QUAD 0.5 ML IM SUSY
0.5000 mL | PREFILLED_SYRINGE | INTRAMUSCULAR | Status: AC
  Administered 2015-01-25: 0.5 mL via INTRAMUSCULAR
  Filled 2015-01-24: qty 0.5

## 2015-01-24 MED ORDER — IBUPROFEN 600 MG PO TABS
600.0000 mg | ORAL_TABLET | Freq: Four times a day (QID) | ORAL | Status: DC
  Administered 2015-01-24 – 2015-01-25 (×5): 600 mg via ORAL
  Filled 2015-01-24 (×5): qty 1

## 2015-01-24 MED ORDER — OXYCODONE-ACETAMINOPHEN 5-325 MG PO TABS: 1.0000 | ORAL_TABLET | ORAL | Status: DC | PRN

## 2015-01-24 MED ORDER — DIBUCAINE 1 % RE OINT: 1.0000 "application " | TOPICAL_OINTMENT | RECTAL | Status: DC | PRN

## 2015-01-24 MED ORDER — SIMETHICONE 80 MG PO CHEW: 80.0000 mg | CHEWABLE_TABLET | ORAL | Status: DC | PRN

## 2015-01-24 MED ORDER — ONDANSETRON HCL 4 MG/2ML IJ SOLN: 4.0000 mg | INTRAMUSCULAR | Status: DC | PRN

## 2015-01-24 MED ORDER — ZOLPIDEM TARTRATE 5 MG PO TABS: 5.0000 mg | ORAL_TABLET | Freq: Every evening | ORAL | Status: DC | PRN

## 2015-01-24 MED ORDER — BENZOCAINE-MENTHOL 20-0.5 % EX AERO
1.0000 | INHALATION_SPRAY | CUTANEOUS | Status: DC | PRN
Start: 2015-01-24 — End: 2015-01-25
  Filled 2015-01-24: qty 56

## 2015-01-24 MED ORDER — WITCH HAZEL-GLYCERIN EX PADS: 1.0000 "application " | MEDICATED_PAD | CUTANEOUS | Status: DC | PRN

## 2015-01-24 MED ORDER — LANOLIN HYDROUS EX OINT: TOPICAL_OINTMENT | CUTANEOUS | Status: DC | PRN

## 2015-01-24 MED ORDER — TETANUS-DIPHTH-ACELL PERTUSSIS 5-2.5-18.5 LF-MCG/0.5 IM SUSP: 0.5000 mL | Freq: Once | INTRAMUSCULAR | Status: DC

## 2015-01-24 NOTE — Anesthesia Postprocedure Evaluation (Signed)
  Anesthesia Post-op Note  Patient: DANIQUE HARTSOUGH  Procedure(s) Performed: * No procedures listed *  Patient Location: Mother/Baby  Anesthesia Type:Epidural  Level of Consciousness: awake, alert , oriented and patient cooperative  Airway and Oxygen Therapy: Patient Spontanous Breathing  Post-op Pain: mild  Post-op Assessment: Post-op Vital signs reviewed, Patient's Cardiovascular Status Stable, Respiratory Function Stable, Patent Airway, No signs of Nausea or vomiting, Adequate PO intake, Pain level controlled, No headache, No backache and Patient able to bend at knees              Post-op Vital Signs: Reviewed and stable  Last Vitals:  Filed Vitals:   01/24/15 0850  BP: 135/78  Pulse: 77  Temp: 37 C  Resp: 18    Complications: No apparent anesthesia complications

## 2015-01-24 NOTE — Progress Notes (Signed)
Anna Sexton   Subjective: Post Partum Day 0 Vaginal delivery, no laceration Patient up ad lib, denies syncope or dizziness. Reports consuming regular diet without issues and denies N/V No issues with urination and reports bleeding is appropriate  Feeding:  breast Contraceptive plan:   condoms  Objective: Temp:  [98 F (36.7 C)-99.5 F (37.5 C)] 98.6 F (37 C) (09/14 0850) Pulse Rate:  [68-112] 77 (09/14 0850) Resp:  [16-18] 18 (09/14 0850) BP: (107-147)/(57-134) 135/78 mmHg (09/14 0850) SpO2:  [86 %-100 %] 100 % (09/13 1959)  Physical Exam:  General: alert and cooperative Ext: WNL, no significant  edema. No evidence of DVT seen on physical exam. Breast: Soft filling Lungs: CTAB Heart RRR without murmur  Abdomen:  Soft, fundus firm, lochia scant, + bowel sounds, non distended, non tender Lochia: appropriate Uterine Fundus: firm Laceration: n/a    Recent Labs  01/23/15 1440 01/24/15 0621  HGB 10.7* 10.3*  HCT 32.3* 31.6*    Assessment S/P Vaginal Delivery-Day 0 Stable  Normal Involution Breastfeeding   Plan: Continue current care Breastfeeding and Lactation consult Lactation support   Evan Mackie, CNM, MSN 01/24/2015, 10:12 AM

## 2015-01-25 MED ORDER — IBUPROFEN 600 MG PO TABS: 600.0000 mg | ORAL_TABLET | Freq: Four times a day (QID) | ORAL | Status: DC

## 2015-01-25 MED ORDER — OXYCODONE-ACETAMINOPHEN 5-325 MG PO TABS: 1.0000 | ORAL_TABLET | ORAL | Status: DC | PRN

## 2015-01-25 MED ORDER — FERROUS SULFATE 325 (65 FE) MG PO TABS: 325.0000 mg | ORAL_TABLET | Freq: Two times a day (BID) | ORAL | Status: DC

## 2015-01-25 MED ORDER — FERROUS SULFATE 325 (65 FE) MG PO TBEC: 325.0000 mg | DELAYED_RELEASE_TABLET | Freq: Two times a day (BID) | ORAL | Status: DC

## 2015-01-25 NOTE — Discharge Summary (Signed)
Vaginal Delivery Discharge Summary  Anna Sexton  DOB:    1991/03/07 MRN:    [id number] CSN:    [id number]  Date of admission:                  01/23/15  Date of discharge:                   01/25/15  Procedures this admission:   SVD  Date of Delivery: 01/24/15  Newborn Data:  Live born female  Birth Weight: 6 lb 4.7 oz (2855 g) APGAR: 8, 7  Home with mother. Name: Anna Sexton   History of Present Illness:  [name] is a 24 y.o. female, G2P2002, who presents at [redacted]w[redacted]d weeks gestation. The patient has been followed at [facility] division of [facility]. She was admitted for induction of labor. Her pregnancy has been complicated by:  Patient Active Problem List   Diagnosis Date Noted  . SVD (spontaneous vaginal delivery) 01/24/2015  . Normal labor 01/23/2015  . Positive GBS test 01/23/2015  . Supervision of normal first pregnancy 10/11/2012  . Headache disorder 10/15/2011  . THYROID NODULE 06/11/2009  . ATRIAL SEPTAL DEFECT, HX OF 06/11/2009     Hospital Course:  The patient was admitted for IOL for GDM. Her GBS was positive.  Her labor was not complicated. Delivery was performed by Gerrit Heck CNM without complication.  She proceeded to have a vaginal delivery of a healthy infant that remained in room with mother. Her postpartum course was not complicated. She was discharged to home in stable condition on postpartum day 1 doing well.  Intrapartum Procedures: spontaneous vaginal delivery Postpartum Procedures: none Complications-Operative and Postpartum: none  Discharge Diagnoses: Term Pregnancy-delivered  Feeding:  breast  Contraception:  condoms  Hemoglobin Results:  CBC Latest Ref Rng 01/24/2015 01/23/2015 01/23/2015  WBC 4.0 - 10.5 K/uL 13.6(H) 8.4 8.2  Hemoglobin 12.0 - 15.0 g/dL 10.3(L) 10.7(L) 11.1(L)  Hematocrit 36.0 - 46.0 % 31.6(L) 32.3(L) 33.7(L)  Platelets 150 - 400 K/uL 234 243 256     Discharge Physical Exam:   General: alert and cooperative Lochia: appropriate Uterine Fundus: firm Incision: healing well DVT Evaluation: No evidence of DVT seen on physical exam.   Discharge Information:  Activity:           pelvic rest Diet:                routine Medications: PNV, Ibuprofen, Iron and Percocet Condition:      stable Instructions:  Routine pp instructions   Discharge to: home  Follow-up Information    Follow up with [facility]. Schedule an appointment as soon as possible for a visit in 6 weeks.   Specialty:  Obstetrics and Gynecology   Why:  Postpartum check up   Contact information:   [address] [place] [place] [address] [phone]        Anna Sexton, CNM, MSN 01/25/2015. 7:17 AM      Postpartum Care After Vaginal Delivery  After you deliver your newborn (postpartum period), the usual stay in the hospital is 24 72 hours. If there were problems with your labor or delivery, or if you have other medical problems, you might be in the hospital longer.  While you are in the hospital, you will receive help and instructions on how to care for yourself and your newborn during the postpartum period.  While you are in  the hospital:  Be sure to tell your nurses if you have pain or discomfort, as well as where you feel the pain and what makes the pain worse.  If you had an incision made near your vagina (episiotomy) or if you had some tearing during delivery, the nurses may put ice packs on your episiotomy or tear. The ice packs may help to reduce the pain and swelling.  If you are breastfeeding, you may feel uncomfortable contractions of your uterus for a couple of weeks. This is normal. The contractions help your uterus get back to normal size.  It is normal to have some bleeding after delivery.  For the first 1 3 days after delivery, the flow is red and the amount may be similar to a  period.  It is common for the flow to start and stop.  In the first few days, you may pass some small clots. Let your nurses know if you begin to pass large clots or your flow increases.  Do not  flush blood clots down the toilet before having the nurse look at them.  During the next 3 10 days after delivery, your flow should become more watery and pink or brown-tinged in color.  Ten to fourteen days after delivery, your flow should be a small amount of yellowish-white discharge.  The amount of your flow will decrease over the first few weeks after delivery. Your flow may stop in 6 8 weeks. Most women have had their flow stop by 12 weeks after delivery.  You should change your sanitary pads frequently.  Wash your hands thoroughly with soap and water for at least 20 seconds after changing pads, using the toilet, or before holding or feeding your newborn.  You should feel like you need to empty your bladder within the first 6 8 hours after delivery.  In case you become weak, lightheaded, or faint, call your nurse before you get out of bed for the first time and before you take a shower for the first time.  Within the first few days after delivery, your breasts may begin to feel tender and full. This is called engorgement. Breast tenderness usually goes away within 48 72 hours after engorgement occurs. You may also notice milk leaking from your breasts. If you are not breastfeeding, do not stimulate your breasts. Breast stimulation can make your breasts produce more milk.  Spending as much time as possible with your newborn is very important. During this time, you and your newborn can feel close and get to know each other. Having your newborn stay in your room (rooming in) will help to strengthen the bond with your newborn. It will give you time to get to know your newborn and become comfortable caring for your newborn.  Your hormones change after delivery. Sometimes the hormone changes can  temporarily cause you to feel sad or tearful. These feelings should not last more than a few days. If these feelings last longer than that, you should talk to your caregiver.  If desired, talk to your caregiver about methods of family planning or contraception.  Talk to your caregiver about immunizations. Your caregiver may want you to have the following immunizations before leaving the hospital:  Tetanus, diphtheria, and pertussis (Tdap) or tetanus and diphtheria (Td) immunization. It is very important that you and your family (including grandparents) or others caring for your newborn are up-to-date with the Tdap or Td immunizations. The Tdap or Td immunization can help protect your newborn from  getting ill.  Rubella immunization.  Varicella (chickenpox) immunization.  Influenza immunization. You should receive this annual immunization if you did not receive the immunization during your pregnancy. Document Released: 02/23/2007 Document Revised: 01/21/2012 Document Reviewed: 12/24/2011 [facility] Patient Information 2014 [place], [place].   Postpartum Depression and Baby Blues  The postpartum period begins right after the birth of a baby. During this time, there is often a great amount of joy and excitement. It is also a time of considerable changes in the life of the parent(s). Regardless of how many times a mother gives birth, each child brings new challenges and dynamics to the family. It is not unusual to have feelings of excitement accompanied by confusing shifts in moods, emotions, and thoughts. All mothers are at risk of developing postpartum depression or the "baby blues." These mood changes can occur right after giving birth, or they may occur many months after giving birth. The baby blues or postpartum depression can be mild or severe. Additionally, postpartum depression can resolve rather quickly, or it can be a long-term condition. CAUSES Elevated hormones and their rapid decline are  thought to be a main cause of postpartum depression and the baby blues. There are a number of hormones that radically change during and after pregnancy. Estrogen and progesterone usually decrease immediately after delivering your baby. The level of thyroid hormone and various cortisol steroids also rapidly drop. Other factors that play a major role in these changes include major life events and genetics.  RISK FACTORS If you have any of the following risks for the baby blues or postpartum depression, know what symptoms to watch out for during the postpartum period. Risk factors that may increase the likelihood of getting the baby blues or postpartum depression include: 1. Havinga personal or family history of depression. 2. Having depression while being pregnant. 3. Having premenstrual or oral contraceptive-associated mood issues. 4. Having exceptional life stress. 5. Having marital conflict. 6. Lacking a social support network. 7. Having a baby with special needs. 8. Having health problems such as diabetes. SYMPTOMS Baby blues symptoms include:  Brief fluctuations in mood, such as going from extreme happiness to sadness.  Decreased concentration.  Difficulty sleeping.  Crying spells, tearfulness.  Irritability.  Anxiety. Postpartum depression symptoms typically begin within the first month after giving birth. These symptoms include:  Difficulty sleeping or excessive sleepiness.  Marked weight loss.  Agitation.  Feelings of worthlessness.  Lack of interest in activity or food. Postpartum psychosis is a very concerning condition and can be dangerous. Fortunately, it is rare. Displaying any of the following symptoms is cause for immediate medical attention. Postpartum psychosis symptoms include:  Hallucinations and delusions.  Bizarre or disorganized behavior.  Confusion or disorientation. DIAGNOSIS  A diagnosis is made by an evaluation of your symptoms. There are no  medical or lab tests that lead to a diagnosis, but there are various questionnaires that a caregiver may use to identify those with the baby blues, postpartum depression, or psychosis. Often times, a screening tool called the [place] Postnatal Depression Scale is used to diagnose depression in the postpartum period.  TREATMENT The baby blues usually goes away on its own in 1 to 2 weeks. Social support is often all that is needed. You should be encouraged to get adequate sleep and rest. Occasionally, you may be given medicines to help you sleep.  Postpartum depression requires treatment as it can last several months or longer if it is not treated. Treatment may include individual or group  therapy, medicine, or both to address any social, physiological, and psychological factors that may play a role in the depression. Regular exercise, a healthy diet, rest, and social support may also be strongly recommended.  Postpartum psychosis is more serious and needs treatment right away. Hospitalization is often needed. HOME CARE INSTRUCTIONS  Get as much rest as you can. Nap when the baby sleeps.  Exercise regularly. Some women find yoga and walking to be beneficial.  Eat a balanced and nourishing diet.  Do little things that you enjoy. Have a cup of tea, take a bubble bath, read your favorite magazine, or listen to your favorite music.  Avoid alcohol.  Ask for help with household chores, cooking, grocery shopping, or running errands as needed. Do not try to do everything.  Talk to people close to you about how you are feeling. Get support from your partner, family members, friends, or other new moms.  Try to stay positive in how you think. Think about the things you are grateful for.  Do not spend a lot of time alone.  Only take medicine as directed by your caregiver.  Keep all your postpartum appointments.  Let your caregiver know if you have any concerns. SEEK MEDICAL CARE IF: You are having  a reaction or problems with your medicine. SEEK IMMEDIATE MEDICAL CARE IF:  You have suicidal feelings.  You feel you may harm the baby or someone else. Document Released: 01/31/2004 Document Revised: 07/21/2011 Document Reviewed: 03/04/2011 [facility] Patient Information 2014 [place], [place].     Breastfeeding Deciding to breastfeed is one of the best choices you can make for you and your baby. A change in hormones during pregnancy causes your breast tissue to grow and increases the number and size of your milk ducts. These hormones also allow proteins, sugars, and fats from your blood supply to make breast milk in your milk-producing glands. Hormones prevent breast milk from being released before your baby is born as well as prompt milk flow after birth. Once breastfeeding has begun, thoughts of your baby, as well as his or her sucking or crying, can stimulate the release of milk from your milk-producing glands.  BENEFITS OF BREASTFEEDING For Your Baby  Your first milk (colostrum) helps your baby's digestive system function better.   There are antibodies in your milk that help your baby fight off infections.   Your baby has a lower incidence of asthma, allergies, and sudden infant death syndrome.   The nutrients in breast milk are better for your baby than infant formulas and are designed uniquely for your baby's needs.   Breast milk improves your baby's brain development.   Your baby is less likely to develop other conditions, such as childhood obesity, asthma, or type 2 diabetes mellitus.  For You   Breastfeeding helps to create a very special bond between you and your baby.   Breastfeeding is convenient. Breast milk is always available at the correct temperature and costs nothing.   Breastfeeding helps to burn calories and helps you lose the weight gained during pregnancy.   Breastfeeding makes your uterus contract to its prepregnancy size faster and slows bleeding  (lochia) after you give birth.   Breastfeeding helps to lower your risk of developing type 2 diabetes mellitus, osteoporosis, and breast or ovarian cancer later in life. SIGNS THAT YOUR BABY IS HUNGRY Early Signs of Hunger  Increased alertness or activity.  Stretching.  Movement of the head from side to side.  Movement of the head  and opening of the mouth when the corner of the mouth or cheek is stroked (rooting).  Increased sucking sounds, smacking lips, cooing, sighing, or squeaking.  Hand-to-mouth movements.  Increased sucking of fingers or hands. Late Signs of Hunger  Fussing.  Intermittent crying. Extreme Signs of Hunger Signs of extreme hunger will require calming and consoling before your baby will be able to breastfeed successfully. Do not wait for the following signs of extreme hunger to occur before you initiate breastfeeding:   Restlessness.  A loud, strong cry.   Screaming.   BREASTFEEDING BASICS Breastfeeding Initiation  Find a comfortable place to sit or lie down, with your neck and back well supported.  Place a pillow or rolled up blanket under your baby to bring him or her to the level of your breast (if you are seated). Nursing pillows are specially designed to help support your arms and your baby while you breastfeed.  Make sure that your baby's abdomen is facing your abdomen.   Gently massage your breast. With your fingertips, massage from your chest wall toward your nipple in a circular motion. This encourages milk flow. You may need to continue this action during the feeding if your milk flows slowly.  Support your breast with 4 fingers underneath and your thumb above your nipple. Make sure your fingers are well away from your nipple and your baby's mouth.   Stroke your baby's lips gently with your finger or nipple.   When your baby's mouth is open wide enough, quickly bring your baby to your breast, placing your entire nipple and as much of  the colored area around your nipple (areola) as possible into your baby's mouth.   More areola should be visible above your baby's upper lip than below the lower lip.   Your baby's tongue should be between his or her lower gum and your breast.   Ensure that your baby's mouth is correctly positioned around your nipple (latched). Your baby's lips should create a seal on your breast and be turned out (everted).  It is common for your baby to suck about 2-3 minutes in order to start the flow of breast milk. Latching Teaching your baby how to latch on to your breast properly is very important. An improper latch can cause nipple pain and decreased milk supply for you and poor weight gain in your baby. Also, if your baby is not latched onto your nipple properly, he or she may swallow some air during feeding. This can make your baby fussy. Burping your baby when you switch breasts during the feeding can help to get rid of the air. However, teaching your baby to latch on properly is still the best way to prevent fussiness from swallowing air while breastfeeding. Signs that your baby has successfully latched on to your nipple:    Silent tugging or silent sucking, without causing you pain.   Swallowing heard between every 3-4 sucks.    Muscle movement above and in front of his or her ears while sucking.  Signs that your baby has not successfully latched on to nipple:   Sucking sounds or smacking sounds from your baby while breastfeeding.  Nipple pain. If you think your baby has not latched on correctly, slip your finger into the corner of your baby's mouth to break the suction and place it between your baby's gums. Attempt breastfeeding initiation again. Signs of Successful Breastfeeding Signs from your baby:   A gradual decrease in the number of sucks or  complete cessation of sucking.   Falling asleep.   Relaxation of his or her body.   Retention of a small amount of milk in his or  her mouth.   Letting go of your breast by himself or herself. Signs from you:  Breasts that have increased in firmness, weight, and size 1-3 hours after feeding.   Breasts that are softer immediately after breastfeeding.  Increased milk volume, as well as a change in milk consistency and color by the fifth day of breastfeeding.   Nipples that are not sore, cracked, or bleeding. Signs That Your Pecola Leisure is Getting Enough Milk  Wetting at least 3 diapers in a 24-hour period. The urine should be clear and pale yellow by age 24 days.  At least 3 stools in a 24-hour period by age 24 days. The stool should be soft and yellow.  At least 3 stools in a 24-hour period by age 26 days. The stool should be seedy and yellow.  No loss of weight greater than 10% of birth weight during the first 28 days of age.  Average weight gain of 4-7 ounces (113-198 g) per week after age 105 days.  Consistent daily weight gain by age 24 days, without weight loss after the age of 2 weeks. After a feeding, your baby may spit up a small amount. This is common. BREASTFEEDING FREQUENCY AND DURATION Frequent feeding will help you make more milk and can prevent sore nipples and breast engorgement. Breastfeed when you feel the need to reduce the fullness of your breasts or when your baby shows signs of hunger. This is called "breastfeeding on demand." Avoid introducing a pacifier to your baby while you are working to establish breastfeeding (the first 4-6 weeks after your baby is born). After this time you may choose to use a pacifier. Research has shown that pacifier use during the first year of a baby's life decreases the risk of sudden infant death syndrome (SIDS). Allow your baby to feed on each breast as long as he or she wants. Breastfeed until your baby is finished feeding. When your baby unlatches or falls asleep while feeding from the first breast, offer the second breast. Because newborns are often sleepy in the first few  weeks of life, you may need to awaken your baby to get him or her to feed. Breastfeeding times will vary from baby to baby. However, the following rules can serve as a guide to help you ensure that your baby is properly fed:  Newborns (babies 74 weeks of age or younger) may breastfeed every 1-3 hours.  Newborns should not go longer than 3 hours during the day or 5 hours during the night without breastfeeding.  You should breastfeed your baby a minimum of 8 times in a 24-hour period until you begin to introduce solid foods to your baby at around 79 months of age. BREAST MILK PUMPING Pumping and storing breast milk allows you to ensure that your baby is exclusively fed your breast milk, even at times when you are unable to breastfeed. This is especially important if you are going back to work while you are still breastfeeding or when you are not able to be present during feedings. Your lactation consultant can give you guidelines on how long it is safe to store breast milk.  A breast pump is a machine that allows you to pump milk from your breast into a sterile bottle. The pumped breast milk can then be stored in a refrigerator or  freezer. Some breast pumps are operated by hand, while others use electricity. Ask your lactation consultant which type will work best for you. Breast pumps can be purchased, but some hospitals and breastfeeding support groups lease breast pumps on a monthly basis. A lactation consultant can teach you how to hand express breast milk, if you prefer not to use a pump.  CARING FOR YOUR BREASTS WHILE YOU BREASTFEED Nipples can become dry, cracked, and sore while breastfeeding. The following recommendations can help keep your breasts moisturized and healthy:  Avoid using soap on your nipples.   Wear a supportive bra. Although not required, special nursing bras and tank tops are designed to allow access to your breasts for breastfeeding without taking off your entire bra or top.  Avoid wearing underwire-style bras or extremely tight bras.  Air dry your nipples for 3-56minutes after each feeding.   Use only cotton bra pads to absorb leaked breast milk. Leaking of breast milk between feedings is normal.   Use lanolin on your nipples after breastfeeding. Lanolin helps to maintain your skin's normal moisture barrier. If you use pure lanolin, you do not need to wash it off before feeding your baby again. Pure lanolin is not toxic to your baby. You may also hand express a few drops of breast milk and gently massage that milk into your nipples and allow the milk to air dry. In the first few weeks after giving birth, some women experience extremely full breasts (engorgement). Engorgement can make your breasts feel heavy, warm, and tender to the touch. Engorgement peaks within 3-5 days after you give birth. The following recommendations can help ease engorgement:  Completely empty your breasts while breastfeeding or pumping. You may want to start by applying warm, moist heat (in the shower or with warm water-soaked hand towels) just before feeding or pumping. This increases circulation and helps the milk flow. If your baby does not completely empty your breasts while breastfeeding, pump any extra milk after he or she is finished.  Wear a snug bra (nursing or regular) or tank top for 1-2 days to signal your body to slightly decrease milk production.  Apply ice packs to your breasts, unless this is too uncomfortable for you.  Make sure that your baby is latched on and positioned properly while breastfeeding. If engorgement persists after 48 hours of following these recommendations, contact your health care provider or a Advertising copywriter. OVERALL HEALTH CARE RECOMMENDATIONS WHILE BREASTFEEDING  Eat healthy foods. Alternate between meals and snacks, eating 3 of each per day. Because what you eat affects your breast milk, some of the foods may make your baby more irritable than  usual. Avoid eating these foods if you are sure that they are negatively affecting your baby.  Drink milk, fruit juice, and water to satisfy your thirst (about 10 glasses a day).   Rest often, relax, and continue to take your prenatal vitamins to prevent fatigue, stress, and anemia.  Continue breast self-awareness checks.  Avoid chewing and smoking tobacco.  Avoid alcohol and drug use. Some medicines that may be harmful to your baby can pass through breast milk. It is important to ask your health care provider before taking any medicine, including all over-the-counter and prescription medicine as well as vitamin and herbal supplements. It is possible to become pregnant while breastfeeding. If birth control is desired, ask your health care provider about options that will be safe for your baby. SEEK MEDICAL CARE IF:   You feel like you  want to stop breastfeeding or have become frustrated with breastfeeding.  You have painful breasts or nipples.  Your nipples are cracked or bleeding.  Your breasts are red, tender, or warm.  You have a swollen area on either breast.  You have a fever or chills.  You have nausea or vomiting.  You have drainage other than breast milk from your nipples.  Your breasts do not become full before feedings by the fifth day after you give birth.  You feel sad and depressed.  Your baby is too sleepy to eat well.  Your baby is having trouble sleeping.   Your baby is wetting less than 3 diapers in a 24-hour period.  Your baby has less than 3 stools in a 24-hour period.  Your baby's skin or the white part of his or her eyes becomes yellow.   Your baby is not gaining weight by 31 days of age. SEEK IMMEDIATE MEDICAL CARE IF:   Your baby is overly tired (lethargic) and does not want to wake up and feed.  Your baby develops an unexplained fever. Document Released: 04/28/2005 Document Revised: 05/03/2013 Document Reviewed: 10/20/2012 [facility]  Patient Information 2015 [place], [place]. This information is not intended to replace advice given to you by your health care provider. Make sure you discuss any questions you have with your health care provider.

## 2016-04-24 ENCOUNTER — Other Ambulatory Visit: Payer: Self-pay | Admitting: Family Medicine

## 2016-04-24 DIAGNOSIS — E041 Nontoxic single thyroid nodule: Secondary | ICD-10-CM

## 2016-05-20 ENCOUNTER — Ambulatory Visit
Admission: RE | Admit: 2016-05-20 | Discharge: 2016-05-20 | Disposition: A | Discharge status: Home / Self Care | Payer: PRIVATE HEALTH INSURANCE | Source: Ambulatory Visit | Attending: Family Medicine | Admitting: Family Medicine

## 2016-05-20 DIAGNOSIS — E041 Nontoxic single thyroid nodule: Secondary | ICD-10-CM

## 2016-05-26 ENCOUNTER — Other Ambulatory Visit: Payer: Self-pay | Admitting: Family Medicine

## 2016-05-26 DIAGNOSIS — R590 Localized enlarged lymph nodes: Secondary | ICD-10-CM

## 2016-05-26 DIAGNOSIS — E041 Nontoxic single thyroid nodule: Secondary | ICD-10-CM

## 2016-05-29 ENCOUNTER — Ambulatory Visit
Admission: RE | Admit: 2016-05-29 | Discharge: 2016-05-29 | Disposition: A | Discharge status: Home / Self Care | Payer: PRIVATE HEALTH INSURANCE | Source: Ambulatory Visit | Attending: Family Medicine | Admitting: Family Medicine

## 2016-05-29 ENCOUNTER — Other Ambulatory Visit (HOSPITAL_COMMUNITY)
Admission: RE | Admit: 2016-05-29 | Discharge: 2016-05-29 | Disposition: A | Discharge status: Home / Self Care | Payer: PRIVATE HEALTH INSURANCE | Source: Ambulatory Visit | Attending: General Surgery | Admitting: General Surgery

## 2016-05-29 DIAGNOSIS — E042 Nontoxic multinodular goiter: Secondary | ICD-10-CM | POA: Insufficient documentation

## 2016-05-29 DIAGNOSIS — R590 Localized enlarged lymph nodes: Secondary | ICD-10-CM

## 2016-05-29 DIAGNOSIS — E041 Nontoxic single thyroid nodule: Secondary | ICD-10-CM

## 2016-05-29 MED ORDER — IOPAMIDOL (ISOVUE-300) INJECTION 61%
75.0000 mL | Freq: Once | INTRAVENOUS | Status: AC | PRN
  Administered 2016-05-29: 75 mL via INTRAVENOUS

## 2016-05-30 ENCOUNTER — Other Ambulatory Visit: Payer: PRIVATE HEALTH INSURANCE

## 2016-12-02 ENCOUNTER — Other Ambulatory Visit: Payer: Self-pay | Admitting: Family Medicine

## 2016-12-02 DIAGNOSIS — E041 Nontoxic single thyroid nodule: Secondary | ICD-10-CM

## 2017-05-06 IMAGING — US US THYROID
1 series · 12 of 25 positions shown · non-contrast
Comparison: None.

CLINICAL DATA: Thyroid nodules.

EXAM:
THYROID ULTRASOUND
TECHNIQUE: Ultrasound examination of the thyroid gland and adjacent soft
tissues was performed.

[Series 1: us thyroid · 0.07mm/px · 12 of 121 slices shown]
[im 6/121]
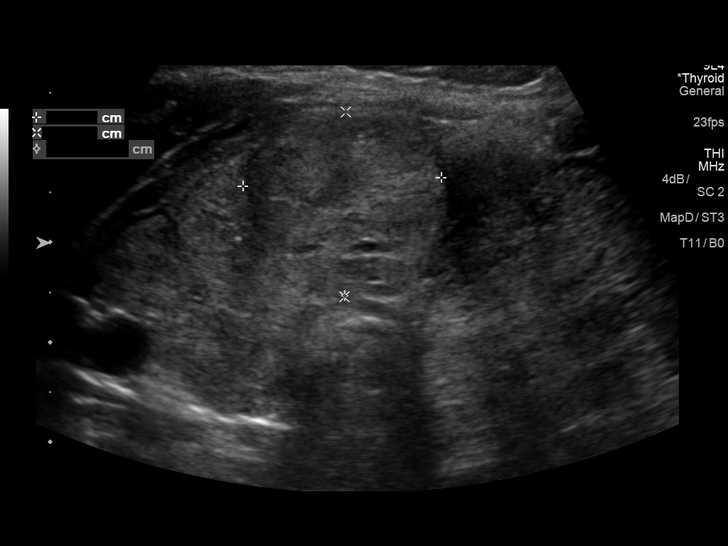
[im 16/121]
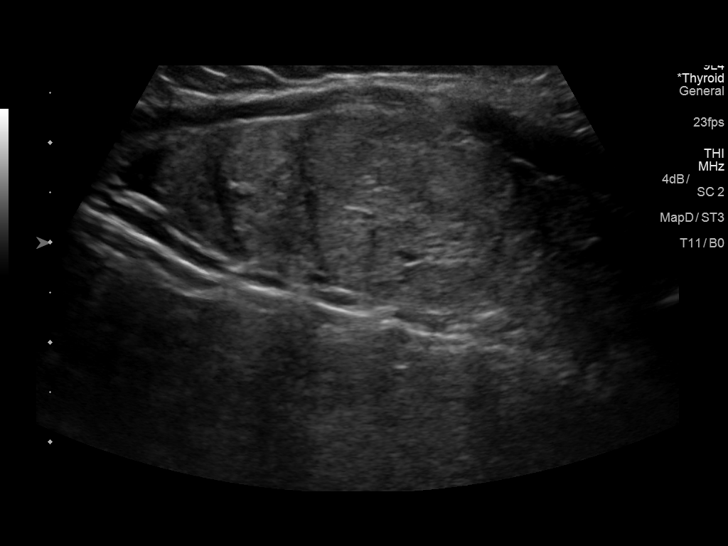
[im 26/121]
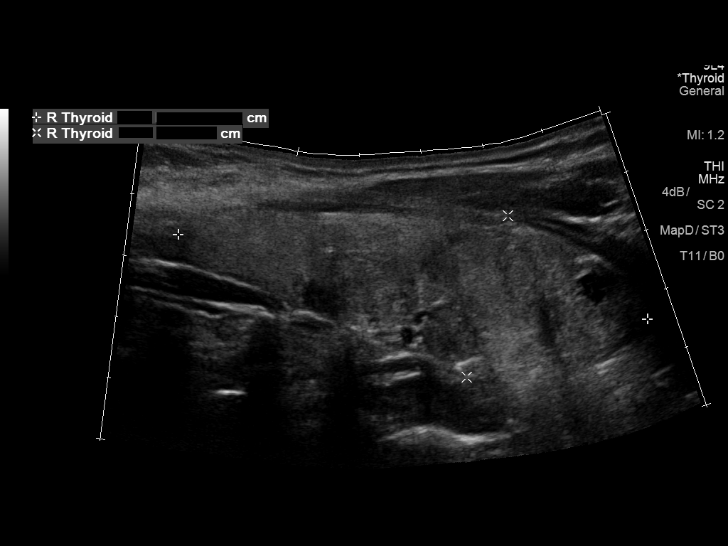
[im 36/121]
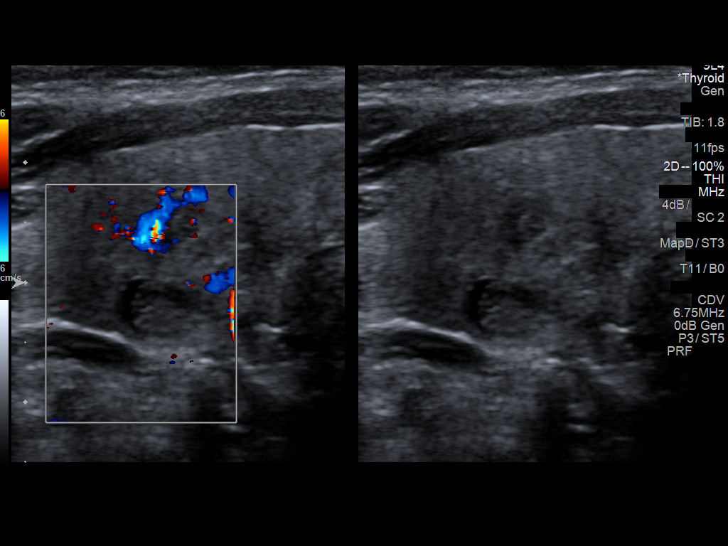
[im 46/121]
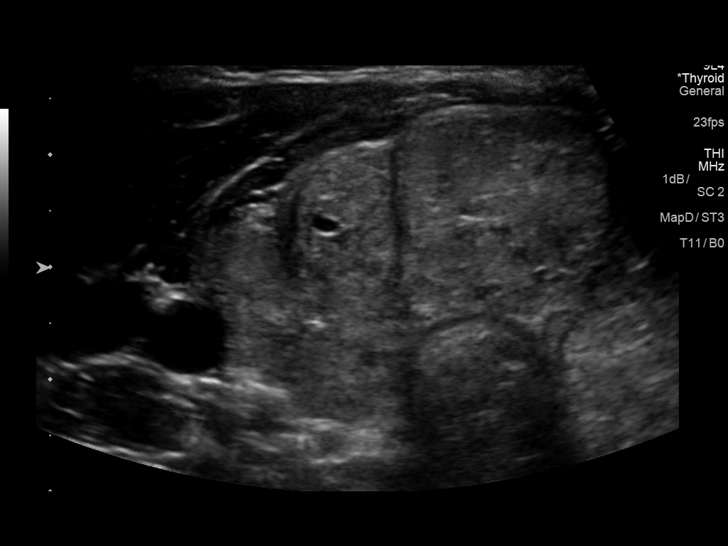
[im 56/121]
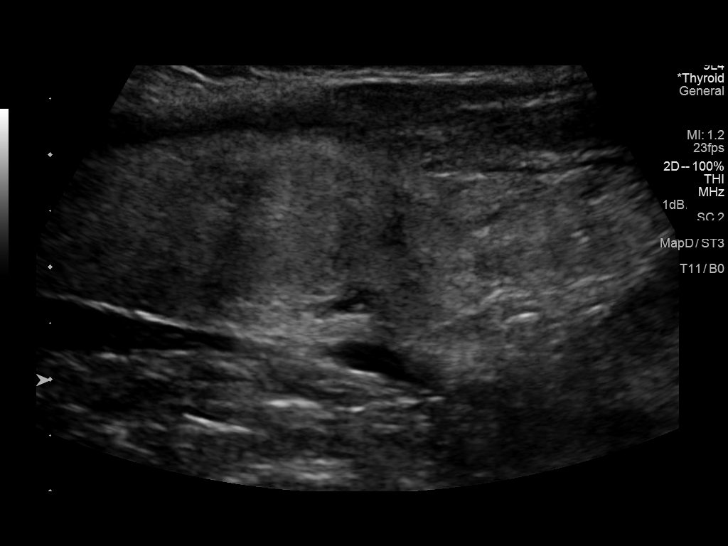
[im 66/121]
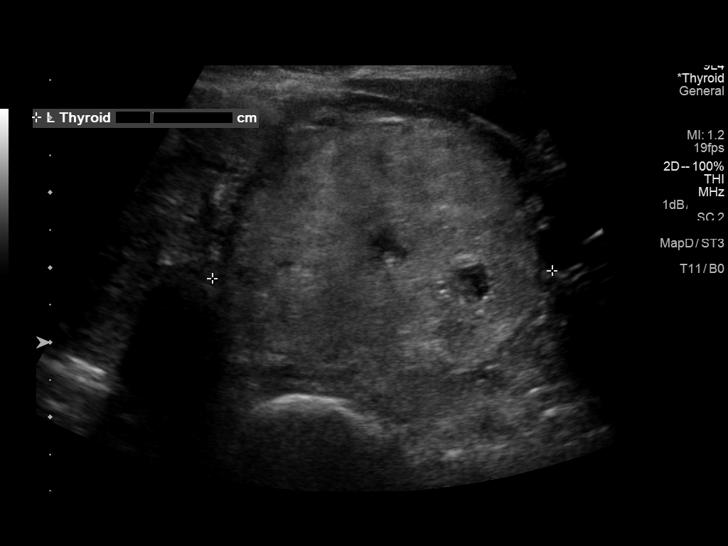
[im 76/121]
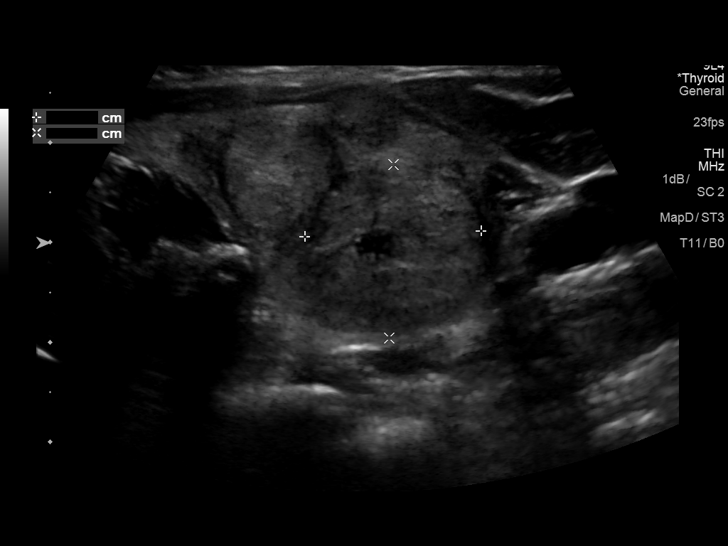
[im 86/121]
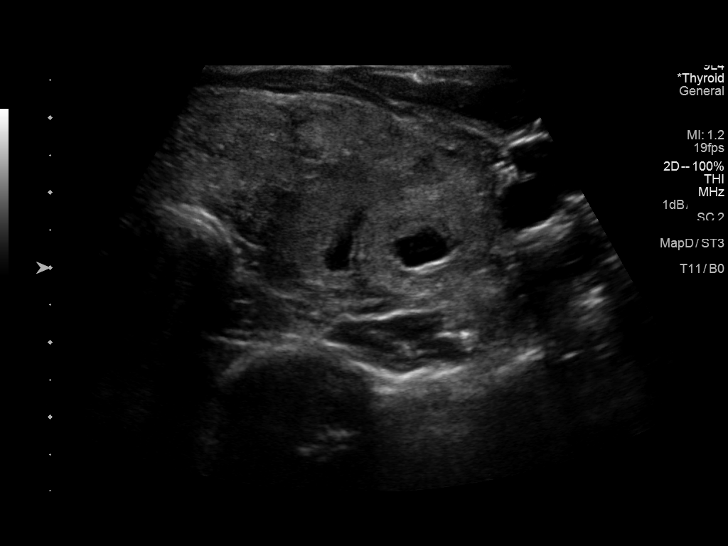
[im 96/121]
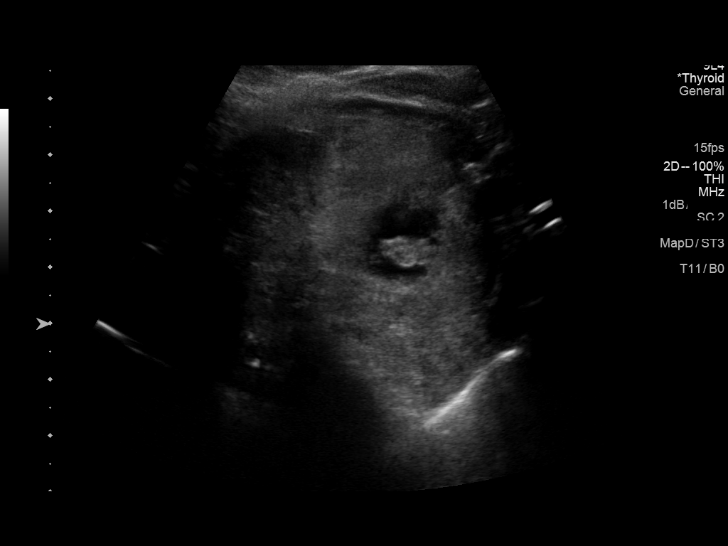
[im 106/121]
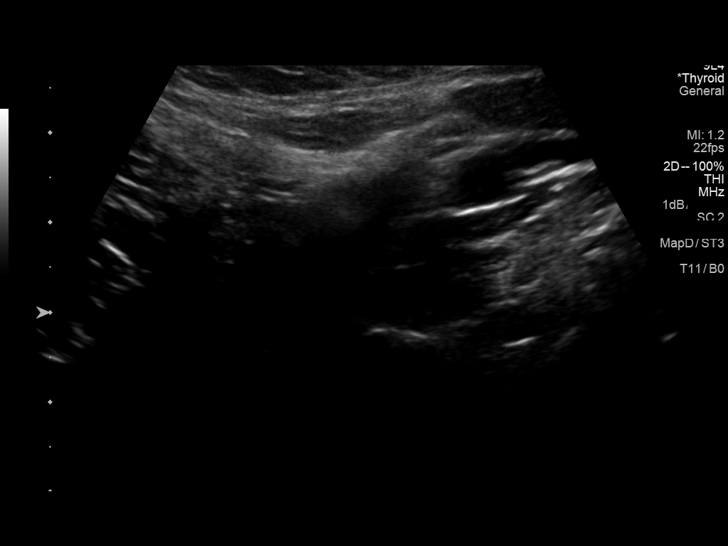
[im 116/121]
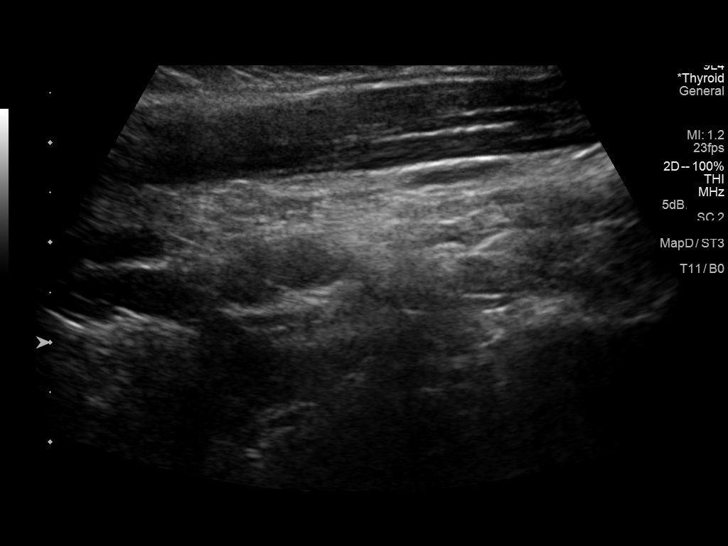

[12 of 25 positions shown; findings below may reference images not displayed]

FINDINGS: Parenchymal Echotexture: Mildly heterogenous

Estimated total number of nodules >/= 1 cm: 5

Number of spongiform nodules >/=  2 cm not described below (TR1): 0

Number of mixed cystic and solid nodules >/= 1.5 cm not described
below (TR2): 0

_________________________________________________________

Isthmus: Measures 2.0 cm in the AP dimension.

Nodule # 1:

Location: Isthmus; Mid

Size: 1.9 x 1.9 x 2.0 cm.

Composition: solid/almost completely solid (2)

Echogenicity: isoechoic (1)

Shape: not taller-than-wide (0)

Margins: ill-defined (0)

Echogenic foci: none (0)

ACR TI-RADS total points: 3.

ACR TI-RADS risk category: TR3 (3 points).

ACR TI-RADS recommendations:

*Given size (>/= 1.5 - 2.4 cm) and appearance, a follow-up
ultrasound in 1 year should be considered based on TI-RADS criteria.

_________________________________________________________

Right lobe: Measures 7.7 x 2.7 x 2.2 cm.

Multiple nodules in the right thyroid lobe.

Nodule # 2:

Location: Right; Mid

Size: 1.6 x 1.3 x 1.3 cm.

Composition: solid/almost completely solid (2)

Echogenicity: isoechoic (1)

Shape: not taller-than-wide (0)

Margins: ill-defined (0)

Echogenic foci: none (0)

ACR TI-RADS total points: 3.

ACR TI-RADS risk category: TR3 (3 points).

ACR TI-RADS recommendations:

*Given size (>/= 1.5 - 2.4 cm) and appearance, a follow-up
ultrasound in 1 year should be considered based on TI-RADS criteria.

Nodule # 3:

Location: Right; Inferior

Size: 2.8 x 1.0 x 1.4 cm.

Composition: solid/almost completely solid (2)

Echogenicity: isoechoic (1)

Shape: not taller-than-wide (0)

Margins: ill-defined (0)

Echogenic foci: none (0)

ACR TI-RADS total points: 3.

ACR TI-RADS risk category: TR3 (3 points).

ACR TI-RADS recommendations:

**Given size (>/= 2.5 cm) and appearance, fine needle aspiration of
this mildly suspicious nodule should be considered based on TI-RADS
criteria.

_________________________________________________________

Left lobe: Measures 8.2 x 3.7 x 4.5 cm. Left thyroid lobe is
difficult to visualize in its entirety because it extends inferiorly
into the chest.

Nodule # 4:

Location: Left; Superior

Size: 1.9 x 1.7 x 1.8 cm.

Composition: solid/almost completely solid (2)

Echogenicity: isoechoic (1)

Shape: not taller-than-wide (0)

Margins: ill-defined (0)

Echogenic foci: none (0)

ACR TI-RADS total points: 3.

ACR TI-RADS risk category: TR3 (3 points).

ACR TI-RADS recommendations:

*Given size (>/= 1.5 - 2.4 cm) and appearance, a follow-up
ultrasound in 1 year should be considered based on TI-RADS criteria.

Nodule # 5:

Location: Left; Mid

Size: 4.3 x 3.9 x 3.7 cm.

Composition: solid/almost completely solid (2)

Echogenicity: isoechoic (1)

Shape: taller-than-wide (3)

Margins: ill-defined (0)

Echogenic foci: none (0)

ACR TI-RADS total points: 6.

ACR TI-RADS risk category: TR4 (4-6 points).

ACR TI-RADS recommendations:

**Given size (>/= 1.5 cm) and appearance, fine needle aspiration of
this moderately suspicious nodule should be considered based on
TI-RADS criteria.
IMPRESSION: Multinodular goiter.

The dominant nodule in the mid left thyroid lobe and the dominant
nodule in the inferior right thyroid lobe both meet criteria for
biopsy.

Additional nodules meet criteria for 1 year follow-up as described.

The above is in keeping with the ACR TI-RADS recommendations - [HOSPITAL] 3006;[DATE].

## 2018-02-03 ENCOUNTER — Encounter (HOSPITAL_COMMUNITY): Payer: Self-pay | Admitting: Obstetrics and Gynecology

## 2018-05-12 NOTE — L&D Delivery Note (Signed)
Delivery Note Pt with increased pressure, pushed very well with 4-5 pushes for delivery.  At 9:08 PM a viable and healthy female was delivered via Vaginal, Spontaneous (Presentation: OA;LOT ).  APGAR: 9, 9; weight  P.   Placenta status: delivered, intact.  Cord: 3V  with the following complications: none.    Anesthesia:  epidural Episiotomy: None Lacerations: Periurethral Suture Repair: 3.0 vicryl rapide Est. Blood Loss (mL):  67cc  Mom to postpartum.  Baby to Couplet care / Skin to Skin.  Masaichi Kracht Bovard-Stuckert 12/28/2018, 9:34 PM  Br/B+/RNI/Tdap/ Contra?  D/w pt and FOB circumcision for female infant inc r/b/a

## 2018-09-01 LAB — OB RESULTS CONSOLE RUBELLA ANTIBODY, IGM: Rubella: IMMUNE

## 2018-09-01 LAB — OB RESULTS CONSOLE HIV ANTIBODY (ROUTINE TESTING): HIV: NONREACTIVE

## 2018-09-01 LAB — OB RESULTS CONSOLE RPR: RPR: NONREACTIVE

## 2018-09-01 LAB — OB RESULTS CONSOLE HEPATITIS B SURFACE ANTIGEN: Hepatitis B Surface Ag: NEGATIVE

## 2018-09-29 LAB — OB RESULTS CONSOLE GBS: GBS: NEGATIVE

## 2018-12-28 ENCOUNTER — Encounter (HOSPITAL_COMMUNITY): Payer: Self-pay | Admitting: *Deleted

## 2018-12-28 ENCOUNTER — Other Ambulatory Visit: Payer: Self-pay

## 2018-12-28 ENCOUNTER — Inpatient Hospital Stay (HOSPITAL_COMMUNITY): Payer: Commercial Managed Care - PPO | Admitting: Anesthesiology

## 2018-12-28 ENCOUNTER — Inpatient Hospital Stay (HOSPITAL_COMMUNITY)
Admission: AD | Admit: 2018-12-28 | Discharge: 2018-12-30 | DRG: 807 | Disposition: A | Discharge status: Home / Self Care | Payer: Commercial Managed Care - PPO | Attending: Obstetrics and Gynecology | Admitting: Obstetrics and Gynecology

## 2018-12-28 DIAGNOSIS — Z20828 Contact with and (suspected) exposure to other viral communicable diseases: Secondary | ICD-10-CM | POA: Diagnosis present

## 2018-12-28 DIAGNOSIS — Z3A38 38 weeks gestation of pregnancy: Secondary | ICD-10-CM

## 2018-12-28 DIAGNOSIS — O134 Gestational [pregnancy-induced] hypertension without significant proteinuria, complicating childbirth: Principal | ICD-10-CM | POA: Diagnosis present

## 2018-12-28 LAB — COMPREHENSIVE METABOLIC PANEL
ALT: 63 U/L — ABNORMAL HIGH (ref 0–44)
AST: 41 U/L (ref 15–41)
Albumin: 3.1 g/dL — ABNORMAL LOW (ref 3.5–5.0)
Alkaline Phosphatase: 125 U/L (ref 38–126)
Anion gap: 10 (ref 5–15)
BUN: 9 mg/dL (ref 6–20)
CO2: 21 mmol/L — ABNORMAL LOW (ref 22–32)
Calcium: 8.9 mg/dL (ref 8.9–10.3)
Chloride: 107 mmol/L (ref 98–111)
Creatinine, Ser: 0.62 mg/dL (ref 0.44–1.00)
GFR calc Af Amer: >60 mL/min (ref >60)
GFR calc non Af Amer: >60 mL/min (ref >60)
Glucose, Bld: 83 mg/dL (ref 70–99)
Potassium: 4 mmol/L (ref 3.5–5.1)
Sodium: 138 mmol/L (ref 135–145)
Total Bilirubin: 0.1 mg/dL — ABNORMAL LOW (ref 0.3–1.2)
Total Protein: 6.5 g/dL (ref 6.5–8.1)

## 2018-12-28 LAB — CBC
HCT: 34.7 % — ABNORMAL LOW (ref 36.0–46.0)
Hemoglobin: 11.5 g/dL — ABNORMAL LOW (ref 12.0–15.0)
MCH: 28.1 pg (ref 26.0–34.0)
MCHC: 33.1 g/dL (ref 30.0–36.0)
MCV: 84.8 fL (ref 80.0–100.0)
Platelets: 269 10*3/uL (ref 150–400)
RBC: 4.09 MIL/uL (ref 3.87–5.11)
RDW: 13.9 % (ref 11.5–15.5)
WBC: 7.7 10*3/uL (ref 4.0–10.5)
nRBC: 0 % (ref 0.0–0.2)

## 2018-12-28 LAB — SARS CORONAVIRUS 2 BY RT PCR (HOSPITAL ORDER, PERFORMED IN ~~LOC~~ HOSPITAL LAB): SARS Coronavirus 2: NEGATIVE

## 2018-12-28 LAB — TYPE AND SCREEN
ABO/RH(D): B POS
Antibody Screen: NEGATIVE

## 2018-12-28 LAB — PROTEIN / CREATININE RATIO, URINE
Creatinine, Urine: 64.79 mg/dL
Protein Creatinine Ratio: 0.09 mg/mg{Cre} (ref 0.00–0.15)
Total Protein, Urine: 6 mg/dL

## 2018-12-28 LAB — ABO/RH: ABO/RH(D): B POS

## 2018-12-28 MED ORDER — ONDANSETRON HCL 4 MG PO TABS: 4.0000 mg | ORAL_TABLET | ORAL | Status: DC | PRN

## 2018-12-28 MED ORDER — SOD CITRATE-CITRIC ACID 500-334 MG/5ML PO SOLN: 30.0000 mL | ORAL | Status: DC | PRN

## 2018-12-28 MED ORDER — PHENYLEPHRINE 40 MCG/ML (10ML) SYRINGE FOR IV PUSH (FOR BLOOD PRESSURE SUPPORT): 80.0000 ug | PREFILLED_SYRINGE | INTRAVENOUS | Status: DC | PRN

## 2018-12-28 MED ORDER — WITCH HAZEL-GLYCERIN EX PADS: 1.0000 "application " | MEDICATED_PAD | CUTANEOUS | Status: DC | PRN

## 2018-12-28 MED ORDER — SENNOSIDES-DOCUSATE SODIUM 8.6-50 MG PO TABS
2.0000 | ORAL_TABLET | ORAL | Status: DC
  Administered 2018-12-29: 2 via ORAL
  Filled 2018-12-28: qty 2

## 2018-12-28 MED ORDER — SIMETHICONE 80 MG PO CHEW: 80.0000 mg | CHEWABLE_TABLET | ORAL | Status: DC | PRN

## 2018-12-28 MED ORDER — OXYCODONE HCL 5 MG PO TABS: 5.0000 mg | ORAL_TABLET | ORAL | Status: DC | PRN

## 2018-12-28 MED ORDER — ACETAMINOPHEN 325 MG PO TABS: 650.0000 mg | ORAL_TABLET | ORAL | Status: DC | PRN

## 2018-12-28 MED ORDER — ZOLPIDEM TARTRATE 5 MG PO TABS: 5.0000 mg | ORAL_TABLET | Freq: Every evening | ORAL | Status: DC | PRN

## 2018-12-28 MED ORDER — DIPHENHYDRAMINE HCL 25 MG PO CAPS: 25.0000 mg | ORAL_CAPSULE | Freq: Four times a day (QID) | ORAL | Status: DC | PRN

## 2018-12-28 MED ORDER — TERBUTALINE SULFATE 1 MG/ML IJ SOLN: 0.2500 mg | Freq: Once | INTRAMUSCULAR | Status: DC | PRN

## 2018-12-28 MED ORDER — BENZOCAINE-MENTHOL 20-0.5 % EX AERO
1.0000 "application " | INHALATION_SPRAY | CUTANEOUS | Status: DC | PRN
  Administered 2018-12-29 (×2): 1 via TOPICAL
  Filled 2018-12-28 (×2): qty 56

## 2018-12-28 MED ORDER — ONDANSETRON HCL 4 MG/2ML IJ SOLN: 4.0000 mg | INTRAMUSCULAR | Status: DC | PRN

## 2018-12-28 MED ORDER — LIDOCAINE HCL (PF) 1 % IJ SOLN
INTRAMUSCULAR | Status: DC | PRN
  Administered 2018-12-28: 11 mL via EPIDURAL

## 2018-12-28 MED ORDER — DIPHENHYDRAMINE HCL 50 MG/ML IJ SOLN: 12.5000 mg | INTRAMUSCULAR | Status: DC | PRN

## 2018-12-28 MED ORDER — DIBUCAINE (PERIANAL) 1 % EX OINT: 1.0000 "application " | TOPICAL_OINTMENT | CUTANEOUS | Status: DC | PRN

## 2018-12-28 MED ORDER — OXYTOCIN BOLUS FROM INFUSION
500.0000 mL | Freq: Once | INTRAVENOUS | Status: AC
  Administered 2018-12-28: 21:00:00 500 mL via INTRAVENOUS

## 2018-12-28 MED ORDER — EPHEDRINE 5 MG/ML INJ: 10.0000 mg | INTRAVENOUS | Status: DC | PRN

## 2018-12-28 MED ORDER — OXYTOCIN 40 UNITS IN NORMAL SALINE INFUSION - SIMPLE MED
1.0000 m[IU]/min | INTRAVENOUS | Status: DC
  Administered 2018-12-28: 2 m[IU]/min via INTRAVENOUS

## 2018-12-28 MED ORDER — LACTATED RINGERS IV SOLN
500.0000 mL | Freq: Once | INTRAVENOUS | Status: AC
  Administered 2018-12-28: 500 mL via INTRAVENOUS

## 2018-12-28 MED ORDER — OXYCODONE-ACETAMINOPHEN 5-325 MG PO TABS: 1.0000 | ORAL_TABLET | ORAL | Status: DC | PRN

## 2018-12-28 MED ORDER — LIDOCAINE HCL (PF) 1 % IJ SOLN
30.0000 mL | INTRAMUSCULAR | Status: AC | PRN
  Administered 2018-12-28: 30 mL via SUBCUTANEOUS
  Filled 2018-12-28: qty 30

## 2018-12-28 MED ORDER — IBUPROFEN 600 MG PO TABS
600.0000 mg | ORAL_TABLET | Freq: Four times a day (QID) | ORAL | Status: DC
  Administered 2018-12-29 (×4): 600 mg via ORAL
  Filled 2018-12-28 (×4): qty 1

## 2018-12-28 MED ORDER — LACTATED RINGERS IV SOLN: INTRAVENOUS | Status: DC

## 2018-12-28 MED ORDER — MEASLES, MUMPS & RUBELLA VAC IJ SOLR
0.5000 mL | Freq: Once | INTRAMUSCULAR | Status: AC
  Administered 2018-12-29: 0.5 mL via SUBCUTANEOUS
  Filled 2018-12-28: qty 0.5

## 2018-12-28 MED ORDER — LACTATED RINGERS IV SOLN: 500.0000 mL | INTRAVENOUS | Status: DC | PRN

## 2018-12-28 MED ORDER — FENTANYL-BUPIVACAINE-NACL 0.5-0.125-0.9 MG/250ML-% EP SOLN
12.0000 mL/h | EPIDURAL | Status: DC | PRN
  Filled 2018-12-28: qty 250

## 2018-12-28 MED ORDER — SODIUM CHLORIDE (PF) 0.9 % IJ SOLN
INTRAMUSCULAR | Status: DC | PRN
  Administered 2018-12-28: 12 mL/h via EPIDURAL

## 2018-12-28 MED ORDER — ACETAMINOPHEN 325 MG PO TABS
650.0000 mg | ORAL_TABLET | ORAL | Status: DC | PRN
  Administered 2018-12-29: 650 mg via ORAL
  Filled 2018-12-28: qty 2

## 2018-12-28 MED ORDER — ONDANSETRON HCL 4 MG/2ML IJ SOLN: 4.0000 mg | Freq: Four times a day (QID) | INTRAMUSCULAR | Status: DC | PRN

## 2018-12-28 MED ORDER — LACTATED RINGERS IV SOLN
INTRAVENOUS | Status: DC
  Administered 2018-12-28 (×2): via INTRAVENOUS

## 2018-12-28 MED ORDER — COCONUT OIL OIL
1.0000 "application " | TOPICAL_OIL | Status: DC | PRN
  Administered 2018-12-29: 1 via TOPICAL

## 2018-12-28 MED ORDER — OXYTOCIN 40 UNITS IN NORMAL SALINE INFUSION - SIMPLE MED
2.5000 [IU]/h | INTRAVENOUS | Status: DC
  Filled 2018-12-28: qty 1000

## 2018-12-28 MED ORDER — OXYCODONE HCL 5 MG PO TABS: 10.0000 mg | ORAL_TABLET | ORAL | Status: DC | PRN

## 2018-12-28 MED ORDER — OXYCODONE-ACETAMINOPHEN 5-325 MG PO TABS: 2.0000 | ORAL_TABLET | ORAL | Status: DC | PRN

## 2018-12-28 MED ORDER — PRENATAL MULTIVITAMIN CH
1.0000 | ORAL_TABLET | Freq: Every day | ORAL | Status: DC
  Administered 2018-12-29: 1 via ORAL
  Filled 2018-12-28: qty 1

## 2018-12-28 MED ORDER — FLEET ENEMA 7-19 GM/118ML RE ENEM: 1.0000 | ENEMA | RECTAL | Status: DC | PRN

## 2018-12-28 NOTE — H&P (Signed)
Anna Sexton is a 28 y.o. female G3P2002 with PIH for IOL.  Has had elevated BP intermittently through out pregnancy.  +FM, no LOF, no VB, occ ctx; d/w pt r/b/a of IOL.  Wishes to proceed.  Has had normal PCR in earlier pregnancy   OB History    Gravida  3   Para  2   Term  2   Preterm      AB      Living  2     SAB      TAB      Ectopic      Multiple  0   Live Births  2          Past Medical History:  Diagnosis Date  . Anemia   . ATRIAL SEPTAL DEFECT, HX OF 06/11/2009  . CHLAMYDIAL INFECTION 05/28/2010  . Heart murmur    ASD repair at 28 years of age  . THYROID NODULE 06/11/2009  . Urinary frequency 05/24/2010   Past Surgical History:  Procedure Laterality Date  . ASD REPAIR     2008  . CARDIAC CATHETERIZATION  10/2007   for ASD repair   Family History: family history includes Arthritis in her mother; Depression in her maternal grandmother; Diabetes in her father; Diabetes insipidus in her father; Hearing loss in her maternal grandmother; Heart disease in her father; Hyperlipidemia in her father; Hypertension in her father; Stroke in her father. Social History:  reports that she has never smoked. She has never used smokeless tobacco. She reports that she does not drink alcohol or use drugs.married, NICU RN  Meds PNV All NKDA     Maternal Diabetes: No Genetic Screening: Declined Maternal Ultrasounds/Referrals: Normal Fetal Ultrasounds or other Referrals:  None Maternal Substance Abuse:  No Significant Maternal Medications:  None Significant Maternal Lab Results:  Group B Strep negative Other Comments:  None  Review of Systems  Constitutional: Negative.   HENT: Negative.   Eyes: Negative.   Respiratory: Negative.   Cardiovascular: Negative.   Gastrointestinal: Negative.   Genitourinary: Negative.   Musculoskeletal: Negative.   Skin: Negative.   Neurological: Negative.   Psychiatric/Behavioral: Negative.    Maternal Medical History:   Contractions: Frequency: irregular.    Fetal activity: Perceived fetal activity is normal.    Prenatal complications: PIH.   Prenatal Complications - Diabetes: none.      Blood pressure (!) 138/91, pulse 78, temperature 98.5 F (36.9 C), temperature source Oral, resp. rate 20, height 5\' 4"  (1.626 m), weight 94.3 kg, last menstrual period 04/02/2018, unknown if currently breastfeeding. Maternal Exam:  Uterine Assessment: Contraction strength is moderate.  Abdomen: Patient reports no abdominal tenderness. Fundal height is appropriate for gestation age.   Fetal presentation: vertex  Introitus: Normal vulva. Normal vagina.  Cervix: Cervix evaluated by digital exam.     Physical Exam  Constitutional: She is oriented to person, place, and time. She appears well-developed and well-nourished.  HENT:  Head: Normocephalic and atraumatic.  Cardiovascular: Normal rate and regular rhythm.  Respiratory: Effort normal and breath sounds normal. No respiratory distress. She has no wheezes.  GI: Soft. Bowel sounds are normal. She exhibits no distension. There is no abdominal tenderness.  Genitourinary:    Vulva normal.   Musculoskeletal: Normal range of motion.  Neurological: She is alert and oriented to person, place, and time.  Skin: Skin is warm and dry.  Psychiatric: She has a normal mood and affect. Her behavior is normal.    Prenatal  labs: ABO, Rh: --/--/PENDING (08/18 1714)B+ Antibody: PENDING (08/18 1714)neg Rubella:  Nonimmune RPR:   NR HBsAg:   NR HIV:   NR GBS:   neg  Hgb 12.6/Plt 236/ Ur Cx neg/Varicella immune/Hgb electro WNL/glucola 98/  Nl anat, post plac, female  TOC at [facility] week from [facility] Tdap 6/10   Assessment/Plan: Admit to L&D, IOL with AROM/pitocin Monitor BP and labs Epidural prn Expect SVD  Anna Sexton 12/28/2018, 5:40 PM

## 2018-12-28 NOTE — Progress Notes (Signed)
Patient ID: Anna Sexton, female   DOB: [birth year], 28 y.o.   MRN: [id number]   Pt changing rapidly.  Some increased pressure  AFVSS gen NAD  FHTs 120-130's, mod var, + accels, category 1, some earlies toco Q 2-4 min  9/100/+1  Expect pt to be complete and pushing soon.

## 2018-12-28 NOTE — MAU Note (Signed)
Covid swab collected. Pt tolerated well. PT asymptomatic 

## 2018-12-28 NOTE — Progress Notes (Signed)
Patient ID: Anna Sexton, female   DOB: January 31, 1991, 28 y.o.   MRN: [id number]  AROM for clear fluid 4/80/-1  Epidural prn  FHTs 120's,mod var, + accels,  toco q 2-4 min

## 2018-12-28 NOTE — Anesthesia Procedure Notes (Signed)
Epidural Patient location during procedure: OB Start time: 12/28/2018 7:38 PM End time: 12/28/2018 7:55 PM  Staffing Anesthesiologist: Lynda Rainwater, MD Performed: anesthesiologist   Preanesthetic Checklist Completed: patient identified, site marked, surgical consent, pre-op evaluation, timeout performed, IV checked, risks and benefits discussed and monitors and equipment checked  Epidural Patient position: sitting Prep: ChloraPrep Patient monitoring: heart rate, cardiac monitor, continuous pulse ox and blood pressure Approach: midline Location: L2-L3 Injection technique: LOR saline  Needle:  Needle type: Tuohy  Needle gauge: 17 G Needle length: 9 cm Needle insertion depth: 6 cm Catheter type: closed end flexible Catheter size: 20 Guage Catheter at skin depth: 10 cm Test dose: negative  Assessment Events: blood not aspirated, injection not painful, no injection resistance, negative IV test and no paresthesia  Additional Notes Reason for block:procedure for pain

## 2018-12-28 NOTE — Anesthesia Preprocedure Evaluation (Signed)
Anesthesia Evaluation  Patient identified by MRN, date of birth, ID band Patient awake    Reviewed: Allergy & Precautions, H&P , NPO status , Patient's Chart, lab work & pertinent test results  Airway Mallampati: II  TM Distance: >3 FB Neck ROM: full    Dental no notable dental hx.    Pulmonary neg pulmonary ROS,    Pulmonary exam normal        Cardiovascular negative cardio ROS Normal cardiovascular exam     Neuro/Psych negative psych ROS   GI/Hepatic negative GI ROS, Neg liver ROS,   Endo/Other  negative endocrine ROS  Renal/GU negative Renal ROS     Musculoskeletal   Abdominal (+) + obese,   Peds  Hematology   Anesthesia Other Findings   Reproductive/Obstetrics (+) Pregnancy                             Anesthesia Physical  Anesthesia Plan  ASA: II  Anesthesia Plan: Epidural   Post-op Pain Management:    Induction:   PONV Risk Score and Plan:   Airway Management Planned:   Additional Equipment:   Intra-op Plan:   Post-operative Plan:   Informed Consent: I have reviewed the patients History and Physical, chart, labs and discussed the procedure including the risks, benefits and alternatives for the proposed anesthesia with the patient or authorized representative who has indicated his/her understanding and acceptance.       Plan Discussed with:   Anesthesia Plan Comments:         Anesthesia Quick Evaluation

## 2018-12-29 LAB — CBC
HCT: 34.9 % — ABNORMAL LOW (ref 36.0–46.0)
Hemoglobin: 11.4 g/dL — ABNORMAL LOW (ref 12.0–15.0)
MCH: 28.2 pg (ref 26.0–34.0)
MCHC: 32.7 g/dL (ref 30.0–36.0)
MCV: 86.4 fL (ref 80.0–100.0)
Platelets: 237 10*3/uL (ref 150–400)
RBC: 4.04 MIL/uL (ref 3.87–5.11)
RDW: 13.9 % (ref 11.5–15.5)
WBC: 12 10*3/uL — ABNORMAL HIGH (ref 4.0–10.5)
nRBC: 0 % (ref 0.0–0.2)

## 2018-12-29 LAB — RPR: RPR Ser Ql: NONREACTIVE

## 2018-12-29 NOTE — Progress Notes (Signed)
Patient ID: Anna Sexton, female   DOB: 14-Apr-1991, 28 y.o.   MRN: [id number] Pt doing well. Requesting discharge to home tonight. Denies HA or blurry vision.  VSS - 122/75 ( 122-140/75-91), 70 GEN - NAD ABD - FF EXT - no homans   A/P: PPD#1 - stable         BP stable - rec pt call office with BP ( has home cuff) on 12/31/18                          - I week BP check          Discharge to home ; 6 week postpartum visit

## 2018-12-29 NOTE — Lactation Note (Signed)
This note was copied from a baby's chart. Lactation Consultation Note Baby 4 hrs old. Experienced BF mom states baby has been BF great several times since delivery. Mom feeding swaddled baby in cradle position, denies painful latch. Encouraged STS. Mom keeping good I&O. 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 Mom requesting pump kit to take home needing parts for her pump. Mom has insurance w/husband so doesn't qualify for employee pump. Mom has Medela DEBP at home. Reviewed basic BF information, positioning, pumping, and breast massage. Encouraged to call for assistance or questions. Lactation brochure given.  Patient Name: Anna Sexton [id number] Date: 12/29/2018 Reason for consult: Initial assessment;Early term 37-38.6wks   Maternal Data Has patient been taught Hand Expression?: Yes Does the patient have breastfeeding experience prior to this delivery?: Yes  Feeding Feeding Type: Breast Fed  LATCH Score Latch: Grasps breast easily, tongue down, lips flanged, rhythmical sucking.  Audible Swallowing: Spontaneous and intermittent  Type of Nipple: Everted at rest and after stimulation  Comfort (Breast/Nipple): Soft / non-tender  Hold (Positioning): No assistance needed to correctly position infant at breast.  LATCH Score: 10  Interventions Interventions: Breast feeding basics reviewed;Support pillows;Breast compression;Skin to skin  Lactation Tools Discussed/Used WIC Program: No   Consult Status Consult Status: PRN Date: 12/30/18 Follow-up type: In-patient    Anna Sexton, Elta Guadeloupe 12/29/2018, 1:23 AM

## 2018-12-29 NOTE — Discharge Summary (Signed)
OB Discharge Summary     Patient Name: Anna Sexton DOB: 26-Mar-1991 MRN: [id number]  Date of admission: 12/28/2018 Delivering MD: Sherian ReinBOVARD-STUCKERT, JODY   Date of discharge: 12/29/2018  Admitting diagnosis: HBP Intrauterine pregnancy: 5019w4d     Secondary diagnosis:  Principal Problem:   SVD (spontaneous vaginal delivery) Active Problems:   Indication for care in labor or delivery  Additional problems: Ghtn     Discharge diagnosis: Term Pregnancy Delivered and Gestational Hypertension                                                                                                Post partum procedures:none  Augmentation: AROM and Pitocin  Complications: None  Hospital course:  Induction of Labor With Vaginal Delivery   28 y.o. yo G3P3003 at 4919w4d was admitted to the hospital 12/28/2018 for induction of labor.  Indication for induction: Gestational hypertension.  Patient had an uncomplicated labor course as follows: Membrane Rupture Time/Date: 6:11 PM ,12/28/2018   Intrapartum Procedures: Episiotomy: None [1]                                         Lacerations:  Periurethral [8]  Patient had delivery of a Viable infant.  Information for the patient's newborn:  Kristen CardinalMelton, Boy Lollie [[id number]][[id number]]  Delivery Method: Vag-Spont    12/28/2018  Details of delivery can be found in separate delivery note.  Patient had a routine postpartum course. Patient is discharged home 12/29/18.  Physical exam  Vitals:   12/29/18 0819 12/29/18 1100 12/29/18 1239 12/29/18 1514  BP: 140/90 (!) 137/91 (!) 137/91 122/75  Pulse: 68 64 64 70  Resp: 18 17 17 19   Temp: 98.7 F (37.1 C) 99.4 F (37.4 C) 99.4 F (37.4 C) 98.3 F (36.8 C)  TempSrc: Oral Oral Oral Oral  SpO2: 100% 100% 100%   Weight:      Height:       General: alert, cooperative and no distress Lochia: appropriate Uterine Fundus: firm Incision: N/A DVT Evaluation: No evidence of DVT seen on physical exam. Labs: Lab Results   Component Value Date   WBC 12.0 (H) 12/29/2018   HGB 11.4 (L) 12/29/2018   HCT 34.9 (L) 12/29/2018   MCV 86.4 12/29/2018   PLT 237 12/29/2018   CMP Latest Ref Rng & Units 12/28/2018  Glucose 70 - 99 mg/dL 83  BUN 6 - 20 mg/dL 9  Creatinine 7.820.44 - 9.561.00 mg/dL 2.130.62  Sodium 086135 - 578145 mmol/L 138  Potassium 3.5 - 5.1 mmol/L 4.0  Chloride 98 - 111 mmol/L 107  CO2 22 - 32 mmol/L 21(L)  Calcium 8.9 - 10.3 mg/dL 8.9  Total Protein 6.5 - 8.1 g/dL 6.5  Total Bilirubin 0.3 - 1.2 mg/dL 4.6(N0.1(L)  Alkaline Phos 38 - 126 U/L 125  AST 15 - 41 U/L 41  ALT 0 - 44 U/L 63(H)    Discharge instruction: per After Visit Summary and "Baby and Me Booklet".  After visit meds:  Allergies as  of 12/29/2018   No Known Allergies     Medication List    TAKE these medications   ferrous sulfate 325 (65 FE) MG EC tablet Take 1 tablet (325 mg total) by mouth 2 (two) times daily.   ibuprofen 600 MG tablet Commonly known as: ADVIL Take 1 tablet (600 mg total) by mouth every 6 (six) hours.   oxyCODONE-acetaminophen 5-325 MG tablet Commonly known as: PERCOCET/ROXICET Take 1 tablet by mouth every 4 (four) hours as needed (for pain scale 4-7).   prenatal multivitamin Tabs tablet Take 1 tablet by mouth daily at 12 noon.       Diet: routine diet  Activity: Advance as tolerated. Pelvic rest for 6 weeks.   Outpatient follow up:1 week BP check and 6 week postpartum visit Follow up Appt:No future appointments. Follow up Visit:No follow-ups on file.  Postpartum contraception: Not Discussed  Newborn Data: Live born female  Birth Weight: 7 lb 2.3 oz (3240 g) APGAR: 33, 9  Newborn Delivery   Birth date/time: 12/28/2018 21:08:00 Delivery type: Vaginal, Spontaneous      Baby Feeding: Breast Disposition:home with mother   12/29/2018 Isaiah Serge, DO

## 2018-12-29 NOTE — Progress Notes (Signed)
NT retrieved and Charted Mothers Vital signs @1515 

## 2018-12-29 NOTE — Progress Notes (Signed)
Patient ID: Anna Sexton, female   DOB: 12-Jan-1991, 28 y.o.   MRN: [id number] PPD#1 Pt doing well with no complaints. She reports pain well controlled, lochia mild. No HA/CP or SOB. She is ambulating and voiding well. Bonding well with baby - breastfeeding. She would like circumcision in hospital today and discharge to home tonight VSS -140/90 (135-140/77-90), 68 GEN - NAD ABD - FF EXT - no homans  12>11.4<237  A/P: PPD#1 s/p svd - stable         Routine pp care         Likely discharge to home tonight         Discussed circumcision later today

## 2018-12-29 NOTE — Anesthesia Postprocedure Evaluation (Signed)
Anesthesia Post Note  Patient: Anna Sexton  Procedure(s) Performed: AN AD [place]     Patient location during evaluation: Mother Baby Anesthesia Type: Epidural Level of consciousness: awake Pain management: satisfactory to patient Vital Signs Assessment: post-procedure vital signs reviewed and stable Respiratory status: spontaneous breathing Cardiovascular status: stable Anesthetic complications: no    Last Vitals:  Vitals:   12/29/18 0030 12/29/18 0430  BP: (!) 153/90 135/77  Pulse: 72 (!) 59  Resp: 15 16  Temp: 37.2 C 36.9 C  SpO2: 100% 100%    Last Pain:  Vitals:   12/29/18 0532  TempSrc:   PainSc: 0-No pain   Pain Goal: Patients Stated Pain Goal: 5 (12/28/18 2015)                 Casimer Lanius

## 2018-12-29 NOTE — Discharge Instructions (Signed)
Call office with any concerns [phone] 

## 2019-05-13 NOTE — L&D Delivery Note (Addendum)
Delivery Note Pt reached complete dilation rapidly and pushed great and at 3:46 PM a healthy female was delivered via  (Presentation:  OA).  APGAR:8 ,9 ; weight pending .   Placenta status:  Delivered spontaneously.  Cord:   with the following complications:  none.   Anesthesia: None Episiotomy: None Lacerations: Periurethral abrasions Suture Repair: N/a Est. Blood Loss (mL):  Mom to postpartum.  Baby to Couplet care / Skin to Skin.  No severe range BP, d/w pt will need to monitor closely and if develops symptoms or pressures worsen will start magnesuim  D/w pt circumcision and she will let us know if wishes to proceed.  Oliver Pila 12/20/2019, 4:08 PM

## 2019-12-20 ENCOUNTER — Inpatient Hospital Stay (HOSPITAL_COMMUNITY)
Admission: AD | Admit: 2019-12-20 | Discharge: 2019-12-21 | DRG: 807 | Disposition: A | Discharge status: Home / Self Care | Payer: 59 | Attending: Obstetrics and Gynecology | Admitting: Obstetrics and Gynecology

## 2019-12-20 ENCOUNTER — Encounter (HOSPITAL_COMMUNITY): Payer: Self-pay | Admitting: *Deleted

## 2019-12-20 ENCOUNTER — Other Ambulatory Visit: Payer: Self-pay

## 2019-12-20 DIAGNOSIS — Z3A38 38 weeks gestation of pregnancy: Secondary | ICD-10-CM

## 2019-12-20 DIAGNOSIS — O1002 Pre-existing essential hypertension complicating childbirth: Secondary | ICD-10-CM | POA: Diagnosis present

## 2019-12-20 LAB — CBC
HCT: 37.3 % (ref 36.0–46.0)
Hemoglobin: 12 g/dL (ref 12.0–15.0)
MCH: 27.5 pg (ref 26.0–34.0)
MCHC: 32.2 g/dL (ref 30.0–36.0)
MCV: 85.4 fL (ref 80.0–100.0)
Platelets: 323 10*3/uL (ref 150–400)
RBC: 4.37 MIL/uL (ref 3.87–5.11)
RDW: 13.9 % (ref 11.5–15.5)
WBC: 9.2 10*3/uL (ref 4.0–10.5)
nRBC: 0 % (ref 0.0–0.2)

## 2019-12-20 LAB — COMPREHENSIVE METABOLIC PANEL
ALT: 86 U/L — ABNORMAL HIGH (ref 0–44)
AST: 56 U/L — ABNORMAL HIGH (ref 15–41)
Albumin: 3.2 g/dL — ABNORMAL LOW (ref 3.5–5.0)
Alkaline Phosphatase: 138 U/L — ABNORMAL HIGH (ref 38–126)
Anion gap: 11 (ref 5–15)
BUN: 7 mg/dL (ref 6–20)
CO2: 20 mmol/L — ABNORMAL LOW (ref 22–32)
Calcium: 9.2 mg/dL (ref 8.9–10.3)
Chloride: 104 mmol/L (ref 98–111)
Creatinine, Ser: 0.57 mg/dL (ref 0.44–1.00)
GFR calc Af Amer: >60 mL/min (ref >60)
GFR calc non Af Amer: >60 mL/min (ref >60)
Glucose, Bld: 103 mg/dL — ABNORMAL HIGH (ref 70–99)
Potassium: 3.9 mmol/L (ref 3.5–5.1)
Sodium: 135 mmol/L (ref 135–145)
Total Bilirubin: 0.3 mg/dL (ref 0.3–1.2)
Total Protein: 7.1 g/dL (ref 6.5–8.1)

## 2019-12-20 LAB — TYPE AND SCREEN
ABO/RH(D): B POS
Antibody Screen: NEGATIVE

## 2019-12-20 MED ORDER — OXYTOCIN-SODIUM CHLORIDE 30-0.9 UT/500ML-% IV SOLN: 2.5000 [IU]/h | INTRAVENOUS | Status: DC

## 2019-12-20 MED ORDER — COCONUT OIL OIL: 1.0000 "application " | TOPICAL_OIL | Status: DC | PRN

## 2019-12-20 MED ORDER — EPHEDRINE 5 MG/ML INJ: 10.0000 mg | INTRAVENOUS | Status: DC | PRN

## 2019-12-20 MED ORDER — PRENATAL MULTIVITAMIN CH
1.0000 | ORAL_TABLET | Freq: Every day | ORAL | Status: DC
  Administered 2019-12-21: 1 via ORAL
  Filled 2019-12-20: qty 1

## 2019-12-20 MED ORDER — DIBUCAINE (PERIANAL) 1 % EX OINT: 1.0000 "application " | TOPICAL_OINTMENT | CUTANEOUS | Status: DC | PRN

## 2019-12-20 MED ORDER — FLEET ENEMA 7-19 GM/118ML RE ENEM: 1.0000 | ENEMA | RECTAL | Status: DC | PRN

## 2019-12-20 MED ORDER — TERBUTALINE SULFATE 1 MG/ML IJ SOLN: 0.2500 mg | Freq: Once | INTRAMUSCULAR | Status: DC | PRN

## 2019-12-20 MED ORDER — OXYTOCIN BOLUS FROM INFUSION
333.0000 mL | Freq: Once | INTRAVENOUS | Status: AC
  Administered 2019-12-20: 333 mL via INTRAVENOUS

## 2019-12-20 MED ORDER — WITCH HAZEL-GLYCERIN EX PADS: 1.0000 "application " | MEDICATED_PAD | CUTANEOUS | Status: DC | PRN

## 2019-12-20 MED ORDER — LIDOCAINE HCL (PF) 1 % IJ SOLN: 30.0000 mL | INTRAMUSCULAR | Status: DC | PRN

## 2019-12-20 MED ORDER — BENZOCAINE-MENTHOL 20-0.5 % EX AERO
1.0000 "application " | INHALATION_SPRAY | CUTANEOUS | Status: DC | PRN
  Filled 2019-12-20: qty 56

## 2019-12-20 MED ORDER — OXYCODONE-ACETAMINOPHEN 5-325 MG PO TABS: 2.0000 | ORAL_TABLET | ORAL | Status: DC | PRN

## 2019-12-20 MED ORDER — ONDANSETRON HCL 4 MG PO TABS: 4.0000 mg | ORAL_TABLET | ORAL | Status: DC | PRN

## 2019-12-20 MED ORDER — OXYCODONE-ACETAMINOPHEN 5-325 MG PO TABS: 1.0000 | ORAL_TABLET | ORAL | Status: DC | PRN

## 2019-12-20 MED ORDER — SENNOSIDES-DOCUSATE SODIUM 8.6-50 MG PO TABS
2.0000 | ORAL_TABLET | ORAL | Status: DC
  Administered 2019-12-20: 2 via ORAL
  Filled 2019-12-20: qty 2

## 2019-12-20 MED ORDER — ONDANSETRON HCL 4 MG/2ML IJ SOLN: 4.0000 mg | Freq: Four times a day (QID) | INTRAMUSCULAR | Status: DC | PRN

## 2019-12-20 MED ORDER — PHENYLEPHRINE 40 MCG/ML (10ML) SYRINGE FOR IV PUSH (FOR BLOOD PRESSURE SUPPORT): 80.0000 ug | PREFILLED_SYRINGE | INTRAVENOUS | Status: DC | PRN

## 2019-12-20 MED ORDER — OXYTOCIN-SODIUM CHLORIDE 30-0.9 UT/500ML-% IV SOLN
1.0000 m[IU]/min | INTRAVENOUS | Status: DC
  Administered 2019-12-20: 2 m[IU]/min via INTRAVENOUS
  Filled 2019-12-20: qty 500

## 2019-12-20 MED ORDER — SOD CITRATE-CITRIC ACID 500-334 MG/5ML PO SOLN: 30.0000 mL | ORAL | Status: DC | PRN

## 2019-12-20 MED ORDER — LACTATED RINGERS IV SOLN: 500.0000 mL | INTRAVENOUS | Status: DC | PRN

## 2019-12-20 MED ORDER — SIMETHICONE 80 MG PO CHEW: 80.0000 mg | CHEWABLE_TABLET | ORAL | Status: DC | PRN

## 2019-12-20 MED ORDER — ACETAMINOPHEN 325 MG PO TABS
650.0000 mg | ORAL_TABLET | ORAL | Status: DC | PRN
  Administered 2019-12-20 – 2019-12-21 (×3): 650 mg via ORAL
  Filled 2019-12-20 (×3): qty 2

## 2019-12-20 MED ORDER — NIFEDIPINE ER OSMOTIC RELEASE 30 MG PO TB24
30.0000 mg | ORAL_TABLET | Freq: Every day | ORAL | Status: DC
  Administered 2019-12-20 – 2019-12-21 (×2): 30 mg via ORAL
  Filled 2019-12-20 (×3): qty 1

## 2019-12-20 MED ORDER — LACTATED RINGERS IV SOLN: INTRAVENOUS | Status: DC

## 2019-12-20 MED ORDER — ACETAMINOPHEN 325 MG PO TABS: 650.0000 mg | ORAL_TABLET | ORAL | Status: DC | PRN

## 2019-12-20 MED ORDER — IBUPROFEN 600 MG PO TABS
600.0000 mg | ORAL_TABLET | Freq: Four times a day (QID) | ORAL | Status: DC
  Administered 2019-12-20 – 2019-12-21 (×4): 600 mg via ORAL
  Filled 2019-12-20 (×4): qty 1

## 2019-12-20 MED ORDER — ZOLPIDEM TARTRATE 5 MG PO TABS: 5.0000 mg | ORAL_TABLET | Freq: Every evening | ORAL | Status: DC | PRN

## 2019-12-20 MED ORDER — ONDANSETRON HCL 4 MG/2ML IJ SOLN: 4.0000 mg | INTRAMUSCULAR | Status: DC | PRN

## 2019-12-20 MED ORDER — LACTATED RINGERS IV SOLN: 500.0000 mL | Freq: Once | INTRAVENOUS | Status: DC

## 2019-12-20 MED ORDER — FENTANYL-BUPIVACAINE-NACL 0.5-0.125-0.9 MG/250ML-% EP SOLN: 12.0000 mL/h | EPIDURAL | Status: DC | PRN

## 2019-12-20 MED ORDER — TETANUS-DIPHTH-ACELL PERTUSSIS 5-2.5-18.5 LF-MCG/0.5 IM SUSP: 0.5000 mL | Freq: Once | INTRAMUSCULAR | Status: DC

## 2019-12-20 MED ORDER — DIPHENHYDRAMINE HCL 25 MG PO CAPS: 25.0000 mg | ORAL_CAPSULE | Freq: Four times a day (QID) | ORAL | Status: DC | PRN

## 2019-12-20 MED ORDER — DIPHENHYDRAMINE HCL 50 MG/ML IJ SOLN: 12.5000 mg | INTRAMUSCULAR | Status: DC | PRN

## 2019-12-20 NOTE — H&P (Signed)
Anna Sexton is a 29 y.o. female [id number] at 82 2/7 weeks (EDD 01/01/20 by LMP c/w 12 week [place]) presenting for IOL for borderline CHTN with exacerbation.  Pt presented to office today and had BP 140/90 with cervix 4-5 cm dilated though not actively laboring.  She reports no HA or PIH sx.   Prenatal care significant for h/o CHTN and gestational hypertension although has not needed antihypertensives this pregnancy.  She herself had an ASD with repair but declined a fetal ECHO.    OB History    Gravida  4   Para  3   Term  3   Preterm      AB      Living  3     SAB      TAB      Ectopic      Multiple  0   Live Births  3         05-16-2013 . F, Vaginal Delivery  01-24-2015  F, Vaginal Delivery  12-28-2018, 38.4 wks M, 7lbs 2oz, Vaginal Delivery  Past Medical History:  Diagnosis Date  . Anemia   . ATRIAL SEPTAL DEFECT, HX OF 06/11/2009  . CHLAMYDIAL INFECTION 05/28/2010  . Heart murmur    ASD repair at 29 years of age  . SVD (spontaneous vaginal delivery) 12/28/2018  . THYROID NODULE 06/11/2009  . Urinary frequency 05/24/2010   Past Surgical History:  Procedure Laterality Date  . ASD REPAIR     2008  . CARDIAC CATHETERIZATION  10/2007   for ASD repair   Family History: family history includes Arthritis in her mother; Depression in her maternal grandmother; Diabetes in her father; Diabetes insipidus in her father; Hearing loss in her maternal grandmother; Heart disease in her father; Hyperlipidemia in her father; Hypertension in her father; Stroke in her father. Social History:  reports that she has never smoked. She has never used smokeless tobacco. She reports that she does not drink alcohol and does not use drugs.     Maternal Diabetes: No Genetic Screening: Declined Maternal Ultrasounds/Referrals: Normal Fetal Ultrasounds or other Referrals:  None Maternal Substance Abuse:  No Significant Maternal Medications:  None Significant Maternal Lab Results:   Group B Strep negative Other Comments:  None  Review of Systems  Constitutional: Negative for fever.  Gastrointestinal: Negative for abdominal pain.  Neurological: Negative for headaches.   Maternal Medical History:  Contractions: Frequency: irregular.   Perceived severity is mild.    Fetal activity: Perceived fetal activity is normal.    Prenatal complications: PIH.   Prenatal Complications - Diabetes: none.      Blood pressure (!) 146/95, pulse 77, temperature 98.7 F (37.1 C), last menstrual period 03/27/2019, unknown if currently breastfeeding. Maternal Exam:  Uterine Assessment: Contraction strength is mild.  Contraction frequency is irregular.   Abdomen: Patient reports no abdominal tenderness. Fetal presentation: vertex  Introitus: Normal vulva.   Physical Exam Cardiovascular:     Rate and Rhythm: Normal rate and regular rhythm.  Pulmonary:     Effort: Pulmonary effort is normal.  Abdominal:     Palpations: Abdomen is soft.  Genitourinary:    General: Normal vulva.  Neurological:     Mental Status: She is alert.  Psychiatric:        Mood and Affect: Mood normal.    Cervix 80/5/-2 AROM clear   Prenatal labs: ABO, Rh: --/--/B POS (08/10 1336) Antibody: PENDING (08/10 1336) Rubella:  Immunbe RPR: Non Reactive (08/18 1655)  HBsAg:   Neg HIV:   NR GBS:   Neg One hour GCT 108 Hgb AA  Assessment/Plan: Pt admitted for IOL with mildly increased BP and labs at hospital significant for slightly elevated LFT's.  No severe range pressures.  AROM and pitocin begun.  If BP at all severe will start magnesium.  Prot:creat ratio pending.  Pt trying to labor with no meds.    Oliver Pila 12/20/2019, 2:23 PM

## 2019-12-20 NOTE — Progress Notes (Signed)
Patient ID: Anna Sexton, female   DOB: 08-02-1990, 29 y.o.   MRN: [id number] Pt feeling well with no PIH sx, but BP still elevated to 140-150/90's No severe range pressures.  Will start procardia XL 30mg  po q hs and titrate up as needed. Repeat labs in AM to f/u LFT's

## 2019-12-21 LAB — COMPREHENSIVE METABOLIC PANEL
ALT: 80 U/L — ABNORMAL HIGH (ref 0–44)
AST: 53 U/L — ABNORMAL HIGH (ref 15–41)
Albumin: 2.7 g/dL — ABNORMAL LOW (ref 3.5–5.0)
Alkaline Phosphatase: 111 U/L (ref 38–126)
Anion gap: 10 (ref 5–15)
BUN: 7 mg/dL (ref 6–20)
CO2: 23 mmol/L (ref 22–32)
Calcium: 9 mg/dL (ref 8.9–10.3)
Chloride: 104 mmol/L (ref 98–111)
Creatinine, Ser: 0.71 mg/dL (ref 0.44–1.00)
GFR calc Af Amer: >60 mL/min (ref >60)
GFR calc non Af Amer: >60 mL/min (ref >60)
Glucose, Bld: 86 mg/dL (ref 70–99)
Potassium: 4 mmol/L (ref 3.5–5.1)
Sodium: 137 mmol/L (ref 135–145)
Total Bilirubin: 0.3 mg/dL (ref 0.3–1.2)
Total Protein: 6.1 g/dL — ABNORMAL LOW (ref 6.5–8.1)

## 2019-12-21 LAB — CBC
HCT: 34.7 % — ABNORMAL LOW (ref 36.0–46.0)
Hemoglobin: 11.2 g/dL — ABNORMAL LOW (ref 12.0–15.0)
MCH: 27.5 pg (ref 26.0–34.0)
MCHC: 32.3 g/dL (ref 30.0–36.0)
MCV: 85 fL (ref 80.0–100.0)
Platelets: 261 10*3/uL (ref 150–400)
RBC: 4.08 MIL/uL (ref 3.87–5.11)
RDW: 13.9 % (ref 11.5–15.5)
WBC: 11.5 10*3/uL — ABNORMAL HIGH (ref 4.0–10.5)
nRBC: 0 % (ref 0.0–0.2)

## 2019-12-21 LAB — RPR: RPR Ser Ql: NONREACTIVE

## 2019-12-21 MED ORDER — NIFEDIPINE ER 30 MG PO TB24: 30.0000 mg | ORAL_TABLET | Freq: Every day | ORAL | 1 refills | Status: DC

## 2019-12-21 NOTE — Progress Notes (Signed)
Patient has remained normotensive today, baby s/p circumcision in-house. Will discharge today with strict pp PreE precautions. Plan for f/u next week for BP follow up, continue Procardia 30XL QD

## 2019-12-21 NOTE — Progress Notes (Signed)
[name] was referred for history of depression/anxiety. * Referral screened out by Clinical Social Worker because none of the following criteria appear to apply: ~ History of anxiety/depression during this pregnancy, or of post-partum depression following prior delivery. ~ Diagnosis of anxiety and/or depression within last 3 years. Per further chart review, [name] diagnosed with anxiety in 2012.  OR * [name]'s symptoms currently being treated with medication and/or therapy.   Please contact the Clinical Social Worker if needs arise, by [facility] request, or if [name] scores greater than 9/yes to question 10 on Edinburgh Postpartum Depression Screen.   Anna Sexton, MSW, LCSW [facility] [phone]

## 2019-12-21 NOTE — Progress Notes (Signed)
POSTPARTUM PROGRESS NOTE  Post Partum Day #1  Subjective:  No acute events overnight.  Pt denies problems with ambulating, voiding or po intake.  She denies nausea or vomiting.  Pain is well controlled.  Lochia Minimal. Does not want to stay past tonight if possible 2/2 childcare issues and husband's work. Reviewed reasoning for desire for monitoring until PPD#2 (BP and CMP monitoring plus PreE symptoms monitoring). Patient understands but still declines and wants discharge this afternoon if possible. Reviewed that pediatric discharge not up to [facility] physician. Patient husband want office circ if possible, reviewed patient's health history with patient's husband as well. Patient is denying PreE symptoms as well  Objective: Blood pressure (!) 130/91, pulse 64, temperature 98.2 F (36.8 C), temperature source Oral, resp. rate 16, last menstrual period 03/27/2019, unknown if currently breastfeeding.  Physical Exam:  General: alert, cooperative and no distress Lochia:normal flow Chest: CTAB Heart: RRR no m/r/g Abdomen: +BS, soft, nontender Uterine Fundus: firm, 2cm below umbilicus Extremities: neg edema, neg calf TTP BL, neg Homans BL  Recent Labs    12/20/19 1334 12/21/19 0447  HGB 12.0 11.2*  HCT 37.3 34.7*    Assessment/Plan:  ASSESSMENT: Anna Sexton is a 28 y.o. [id number]  s/p SVD @ [redacted]w[redacted]d. PNC c/b likely CHTN, h/o elevated LFTs postpartum. H/o ASD s/p repair, declined fetal echo  CHTN vs GHTN - started on Nif30XL @ 1900 yesterday, BP normotensive since. No SRBP, denies PreE symptoms. FLT trend stable at 56/86>53/80, benign exam. Ideally would want to watch pt thorugh PPD#2 AM, pt is declining this and would like DC this evening if possible. Will let RN staff know as well as peds. Pt aware I will check on her this afternoon and make final decision. Patient desires office circumcision   LOS: 1 day

## 2019-12-21 NOTE — Discharge Summary (Signed)
Postpartum Discharge Summary  Date of Service updated     Patient Name: Anna Sexton DOB: [birth year] MRN: [id number]  Date of admission: 12/20/2019 Delivery date:12/20/2019  Delivering provider: Paula Compton  Date of discharge: 12/21/2019  Admitting diagnosis: Indication for care in labor or delivery [O75.9] NSVD (normal spontaneous vaginal delivery) [O80] Intrauterine pregnancy: [redacted]w[redacted]d    Secondary diagnosis:  Active Problems:   Indication for care in labor or delivery   NSVD (normal spontaneous vaginal delivery)  Additional problems: elevated liver enzymes    Discharge diagnosis: Term Pregnancy Delivered and CHTN                                              Post partum procedures:none Augmentation: AROM and Pitocin Complications: None  Hospital course: Induction of Labor With Vaginal Delivery   29y.o. yo [id number] admitted to the hospital 12/20/2019 for induction of labor.  Indication for induction: HTN.  Patient had an uncomplicated labor course as follows: Membrane Rupture Time/Date: 2:13 PM ,12/20/2019   Delivery Method:Vaginal, Spontaneous  Episiotomy: None  Lacerations:  None  Details of delivery can be found in separate delivery note.  Postpartum course notable for elevated but stable liver enzymes, benign PE. Procardia 30XL, BP WNL PPD#1. Patient is discharged home 12/21/19.  Newborn Data: Birth date:12/20/2019  Birth time:3:46 PM  Gender:Female  Living status:Living  Apgars:9 ,9  Weight:3280 g   Magnesium Sulfate received: No BMZ received: No Rhophylac:No MMR:No T-DaP:Given prenatally Flu: Yes Transfusion:No  Physical exam  Vitals:   12/21/19 0529 12/21/19 1008 12/21/19 1145 12/21/19 1539  BP: (!) 130/91 121/69 126/67 118/79  Pulse: 64 70 68 71  Resp: '16 18 18 16  ' Temp: 98.2 F (36.8 C)   98.9 F (37.2 C)  TempSrc: Oral   Oral   General: alert, cooperative and no distress Lochia: appropriate Uterine Fundus: firm Incision:  N/A DVT Evaluation: No evidence of DVT seen on physical exam. Negative Homan's sign. No cords or calf tenderness. Labs: Lab Results  Component Value Date   WBC 11.5 (H) 12/21/2019   HGB 11.2 (L) 12/21/2019   HCT 34.7 (L) 12/21/2019   MCV 85.0 12/21/2019   PLT 261 12/21/2019   CMP Latest Ref Rng & Units 12/21/2019  Glucose 70 - 99 mg/dL 86  BUN 6 - 20 mg/dL 7  Creatinine 0.44 - 1.00 mg/dL 0.71  Sodium 135 - 145 mmol/L 137  Potassium 3.5 - 5.1 mmol/L 4.0  Chloride 98 - 111 mmol/L 104  CO2 22 - 32 mmol/L 23  Calcium 8.9 - 10.3 mg/dL 9.0  Total Protein 6.5 - 8.1 g/dL 6.1(L)  Total Bilirubin 0.3 - 1.2 mg/dL 0.3  Alkaline Phos 38 - 126 U/L 111  AST 15 - 41 U/L 53(H)  ALT 0 - 44 U/L 80(H)   EdFlavia Shippercore: Edinburgh Postnatal Depression Scale Screening Tool 12/21/2019  I have been able to laugh and see the funny side of things. 0  I have looked forward with enjoyment to things. 0  I have blamed myself unnecessarily when things went wrong. 0  I have been anxious or worried for no good reason. 0  I have felt scared or panicky for no good reason. 0  Things have been getting on top of me. 0  I have been so unhappy that I have had difficulty  sleeping. 0  I have felt sad or miserable. 0  I have been so unhappy that I have been crying. 0  The thought of harming myself has occurred to me. 0  Edinburgh Postnatal Depression Scale Total 0      After visit meds:  Allergies as of 12/21/2019   No Known Allergies     Medication List    STOP taking these medications   acetaminophen 500 MG tablet Commonly known as: TYLENOL     TAKE these medications   NIFEdipine 30 MG 24 hr tablet Commonly known as: ADALAT CC Take 1 tablet (30 mg total) by mouth daily. Start taking on: December 22, 2019   prenatal multivitamin Tabs tablet Take 1 tablet by mouth daily at 12 noon.        Discharge home in stable condition Infant Feeding: Breast Infant Disposition:home with mother Discharge  instruction: per After Visit Summary and Postpartum booklet. Activity: Advance as tolerated. Pelvic rest for 6 weeks.  Diet: routine diet Anticipated Birth Control: Unsure Postpartum Appointment:6 weeks Additional Postpartum F/U: BP check 1 week Future Appointments:No future appointments. Follow up Visit:      12/21/2019 Deliah Boston, MD

## 2020-02-06 ENCOUNTER — Other Ambulatory Visit (HOSPITAL_BASED_OUTPATIENT_CLINIC_OR_DEPARTMENT_OTHER): Payer: Self-pay | Admitting: Internal Medicine

## 2020-02-06 ENCOUNTER — Ambulatory Visit: Payer: 59 | Attending: Internal Medicine

## 2020-02-06 DIAGNOSIS — Z23 Encounter for immunization: Secondary | ICD-10-CM

## 2020-02-06 NOTE — Progress Notes (Signed)
   Covid-19 Vaccination Clinic  Name:  Anna Sexton    MRN: [id number] DOB: 09/15/1990  02/06/2020  [name] was observed post Covid-19 immunization for 15 minutes without incident. She was provided with Vaccine Information Sheet and instruction to access the V-Safe system.   [name] was instructed to call 911 with any severe reactions post vaccine: Marland Kitchen Difficulty breathing  . Swelling of face and throat  . A fast heartbeat  . A bad rash all over body  . Dizziness and weakness   Immunizations Administered    Name Date Dose VIS Date Route   JANSSEN COVID-19 VACCINE 02/06/2020 11:26 AM 0.5 mL 07/09/2019 Intramuscular   Manufacturer: [employer]   Lot: [id number]   NDC: 76160-737-10     Vaccine given by Dionisio David, PharmD Student

## 2020-02-14 MED FILL — JANSSEN COVID-19 VACCINE 0.: 0.5 | 1 days supply | Qty: 1 | Fill #0

## 2021-05-12 NOTE — L&D Delivery Note (Signed)
DELIVERY NOTE  Pt complete and at +2 station with urge to push. Pt pushed and delivered a viable female infant in LOA position. Tight nuchal delivered through. Anterior and posterior shoulders spontaneously delivered with next two pushes; body easily followed next. Infant placed on mothers abdomen and bulb suction of mouth and nose performed. Cord was then clamped and cut by FOB. Cord blood obtained, 3VC. Baby had a vigorous spontaneous cry noted. Placenta then delivered at 1201 intact. Fundal massage performed and pitocin per protocol. Fundus firm. The following lacerations were noted: NONE. Mother and baby stable. Counts correct. EBL 202cc   Infant time: 2359 Gender: female, desires circ Placenta time: 1201 Apgars: 8/9 Weight: pending skin-to-skin

## 2021-10-03 DIAGNOSIS — Z3201 Encounter for pregnancy test, result positive: Secondary | ICD-10-CM | POA: Diagnosis not present

## 2021-10-09 DIAGNOSIS — Z3689 Encounter for other specified antenatal screening: Secondary | ICD-10-CM | POA: Diagnosis not present

## 2021-10-09 DIAGNOSIS — O139 Gestational [pregnancy-induced] hypertension without significant proteinuria, unspecified trimester: Secondary | ICD-10-CM | POA: Diagnosis not present

## 2021-10-09 DIAGNOSIS — Z113 Encounter for screening for infections with a predominantly sexual mode of transmission: Secondary | ICD-10-CM | POA: Diagnosis not present

## 2021-10-09 DIAGNOSIS — Z363 Encounter for antenatal screening for malformations: Secondary | ICD-10-CM | POA: Diagnosis not present

## 2021-10-09 DIAGNOSIS — O26891 Other specified pregnancy related conditions, first trimester: Secondary | ICD-10-CM | POA: Diagnosis not present

## 2021-10-09 DIAGNOSIS — Z3A09 9 weeks gestation of pregnancy: Secondary | ICD-10-CM | POA: Diagnosis not present

## 2021-10-09 DIAGNOSIS — Z209 Contact with and (suspected) exposure to unspecified communicable disease: Secondary | ICD-10-CM | POA: Diagnosis not present

## 2021-10-09 LAB — OB RESULTS CONSOLE RPR: RPR: NONREACTIVE

## 2021-10-09 LAB — HEPATITIS C ANTIBODY: HCV Ab: NEGATIVE

## 2021-10-09 LAB — OB RESULTS CONSOLE RUBELLA ANTIBODY, IGM: Rubella: IMMUNE

## 2021-10-09 LAB — OB RESULTS CONSOLE HEPATITIS B SURFACE ANTIGEN: Hepatitis B Surface Ag: NEGATIVE

## 2021-10-09 LAB — OB RESULTS CONSOLE GC/CHLAMYDIA
Chlamydia: NEGATIVE
Neisseria Gonorrhea: NEGATIVE

## 2021-10-09 LAB — OB RESULTS CONSOLE HIV ANTIBODY (ROUTINE TESTING): HIV: NONREACTIVE

## 2021-12-11 DIAGNOSIS — Z363 Encounter for antenatal screening for malformations: Secondary | ICD-10-CM | POA: Diagnosis not present

## 2021-12-11 DIAGNOSIS — Z3A18 18 weeks gestation of pregnancy: Secondary | ICD-10-CM | POA: Diagnosis not present

## 2022-02-17 DIAGNOSIS — Z23 Encounter for immunization: Secondary | ICD-10-CM | POA: Diagnosis not present

## 2022-02-17 DIAGNOSIS — Z3689 Encounter for other specified antenatal screening: Secondary | ICD-10-CM | POA: Diagnosis not present

## 2022-04-09 DIAGNOSIS — Z23 Encounter for immunization: Secondary | ICD-10-CM | POA: Diagnosis not present

## 2022-04-17 DIAGNOSIS — Z3685 Encounter for antenatal screening for Streptococcus B: Secondary | ICD-10-CM | POA: Diagnosis not present

## 2022-04-17 LAB — OB RESULTS CONSOLE GBS: GBS: NEGATIVE

## 2022-04-30 ENCOUNTER — Inpatient Hospital Stay (HOSPITAL_COMMUNITY)
Admission: AD | Admit: 2022-04-30 | Discharge: 2022-05-02 | DRG: 807 | Disposition: A | Discharge status: Home / Self Care | Payer: BC Managed Care – PPO | Attending: Obstetrics and Gynecology | Admitting: Obstetrics and Gynecology

## 2022-04-30 ENCOUNTER — Encounter (HOSPITAL_COMMUNITY): Payer: Self-pay

## 2022-04-30 ENCOUNTER — Other Ambulatory Visit: Payer: Self-pay

## 2022-04-30 DIAGNOSIS — O1002 Pre-existing essential hypertension complicating childbirth: Principal | ICD-10-CM | POA: Diagnosis present

## 2022-04-30 DIAGNOSIS — O139 Gestational [pregnancy-induced] hypertension without significant proteinuria, unspecified trimester: Secondary | ICD-10-CM | POA: Diagnosis not present

## 2022-04-30 DIAGNOSIS — O10919 Unspecified pre-existing hypertension complicating pregnancy, unspecified trimester: Principal | ICD-10-CM

## 2022-04-30 DIAGNOSIS — Z3A38 38 weeks gestation of pregnancy: Secondary | ICD-10-CM

## 2022-04-30 DIAGNOSIS — O09893 Supervision of other high risk pregnancies, third trimester: Secondary | ICD-10-CM | POA: Diagnosis not present

## 2022-04-30 DIAGNOSIS — R030 Elevated blood-pressure reading, without diagnosis of hypertension: Secondary | ICD-10-CM | POA: Diagnosis not present

## 2022-04-30 LAB — TYPE AND SCREEN
ABO/RH(D): B POS
Antibody Screen: NEGATIVE

## 2022-04-30 LAB — COMPREHENSIVE METABOLIC PANEL
ALT: 64 U/L — ABNORMAL HIGH (ref 0–44)
AST: 39 U/L (ref 15–41)
Albumin: 3.2 g/dL — ABNORMAL LOW (ref 3.5–5.0)
Alkaline Phosphatase: 111 U/L (ref 38–126)
Anion gap: 11 (ref 5–15)
BUN: 11 mg/dL (ref 6–20)
CO2: 20 mmol/L — ABNORMAL LOW (ref 22–32)
Calcium: 9.2 mg/dL (ref 8.9–10.3)
Chloride: 105 mmol/L (ref 98–111)
Creatinine, Ser: 0.65 mg/dL (ref 0.44–1.00)
GFR, Estimated: >60 mL/min (ref >60)
Glucose, Bld: 85 mg/dL (ref 70–99)
Potassium: 3.7 mmol/L (ref 3.5–5.1)
Sodium: 136 mmol/L (ref 135–145)
Total Bilirubin: 0.6 mg/dL (ref 0.3–1.2)
Total Protein: 6.9 g/dL (ref 6.5–8.1)

## 2022-04-30 LAB — URINALYSIS, ROUTINE W REFLEX MICROSCOPIC
Bilirubin Urine: NEGATIVE
Glucose, UA: NEGATIVE mg/dL
Hgb urine dipstick: NEGATIVE
Ketones, ur: 80 mg/dL — AB
Leukocytes,Ua: NEGATIVE
Nitrite: NEGATIVE
Protein, ur: NEGATIVE mg/dL
Specific Gravity, Urine: 1.015 (ref 1.005–1.030)
pH: 6 (ref 5.0–8.0)

## 2022-04-30 LAB — CBC
HCT: 36.7 % (ref 36.0–46.0)
Hemoglobin: 12.3 g/dL (ref 12.0–15.0)
MCH: 28.5 pg (ref 26.0–34.0)
MCHC: 33.5 g/dL (ref 30.0–36.0)
MCV: 85 fL (ref 80.0–100.0)
Platelets: 282 10*3/uL (ref 150–400)
RBC: 4.32 MIL/uL (ref 3.87–5.11)
RDW: 14.2 % (ref 11.5–15.5)
WBC: 9.4 10*3/uL (ref 4.0–10.5)
nRBC: 0 % (ref 0.0–0.2)

## 2022-04-30 LAB — PROTEIN / CREATININE RATIO, URINE
Creatinine, Urine: 85 mg/dL
Protein Creatinine Ratio: 0.12 mg/mg{Cre} (ref 0.00–0.15)
Total Protein, Urine: 10 mg/dL

## 2022-04-30 MED ORDER — OXYCODONE-ACETAMINOPHEN 5-325 MG PO TABS: 2.0000 | ORAL_TABLET | ORAL | Status: DC | PRN

## 2022-04-30 MED ORDER — LACTATED RINGERS IV SOLN: INTRAVENOUS | Status: DC

## 2022-04-30 MED ORDER — LABETALOL HCL 5 MG/ML IV SOLN: 20.0000 mg | INTRAVENOUS | Status: DC | PRN

## 2022-04-30 MED ORDER — LABETALOL HCL 5 MG/ML IV SOLN: 40.0000 mg | INTRAVENOUS | Status: DC | PRN

## 2022-04-30 MED ORDER — LACTATED RINGERS IV SOLN: 500.0000 mL | INTRAVENOUS | Status: DC | PRN

## 2022-04-30 MED ORDER — OXYTOCIN BOLUS FROM INFUSION
333.0000 mL | Freq: Once | INTRAVENOUS | Status: DC
  Administered 2022-05-01: 333 mL via INTRAVENOUS

## 2022-04-30 MED ORDER — OXYCODONE-ACETAMINOPHEN 5-325 MG PO TABS: 1.0000 | ORAL_TABLET | ORAL | Status: DC | PRN

## 2022-04-30 MED ORDER — OXYTOCIN-SODIUM CHLORIDE 30-0.9 UT/500ML-% IV SOLN
2.5000 [IU]/h | INTRAVENOUS | Status: DC
  Filled 2022-04-30: qty 500

## 2022-04-30 MED ORDER — ACETAMINOPHEN 325 MG PO TABS
650.0000 mg | ORAL_TABLET | ORAL | Status: DC | PRN
  Administered 2022-04-30: 650 mg via ORAL
  Filled 2022-04-30: qty 2

## 2022-04-30 MED ORDER — LIDOCAINE HCL (PF) 1 % IJ SOLN: 30.0000 mL | INTRAMUSCULAR | Status: DC | PRN

## 2022-04-30 MED ORDER — ONDANSETRON HCL 4 MG/2ML IJ SOLN: 4.0000 mg | Freq: Four times a day (QID) | INTRAMUSCULAR | Status: DC | PRN

## 2022-04-30 MED ORDER — LABETALOL HCL 5 MG/ML IV SOLN: 80.0000 mg | INTRAVENOUS | Status: DC | PRN

## 2022-04-30 MED ORDER — SOD CITRATE-CITRIC ACID 500-334 MG/5ML PO SOLN: 30.0000 mL | ORAL | Status: DC | PRN

## 2022-04-30 MED ORDER — HYDRALAZINE HCL 20 MG/ML IJ SOLN: 10.0000 mg | INTRAMUSCULAR | Status: DC | PRN

## 2022-04-30 MED ORDER — FENTANYL CITRATE (PF) 100 MCG/2ML IJ SOLN: 50.0000 ug | INTRAMUSCULAR | Status: DC | PRN

## 2022-04-30 NOTE — H&P (Signed)
Anna Sexton is a 31 y.o. female presenting for elevated BP at home and worsening headache. Seen in office where BP was normotensive but PreE labs drawn (still pending) +FM, has been having irr ctx, 3-4 cm in office. Denies VB, possible LOF. Denies RUQ pain, does endorse N/V  PNC s/f 1) H/o GHTN - baseline labs WNL (uPC 0.06), on baby ASA, likely CHTN this pregnancy as first elevated BP in office around 16wks 2) H/o ASD - repaired at 31yo, declined fetal echo 3) Anxiety - stable off meds  GBS neg OB History     Gravida  5   Para  4   Term  4   Preterm      AB      Living  4      SAB      IAB      Ectopic      Multiple  0   Live Births  4          Past Medical History:  Diagnosis Date   Anemia    ATRIAL SEPTAL DEFECT, HX OF 06/11/2009   CHLAMYDIAL INFECTION 05/28/2010   Heart murmur    ASD repair at 31 years of age   Pregnancy induced hypertension    SVD (spontaneous vaginal delivery) 12/28/2018   THYROID NODULE 06/11/2009   Urinary frequency 05/24/2010   Past Surgical History:  Procedure Laterality Date   ASD REPAIR     2008   CARDIAC CATHETERIZATION  10/2007   for ASD repair   Family History: family history includes Arthritis in her mother; Depression in her maternal grandmother; Diabetes in her father; Diabetes insipidus in her father; Hearing loss in her maternal grandmother; Heart disease in her father; Hyperlipidemia in her father; Hypertension in her father; Stroke in her father. Social History:  reports that she has never smoked. She has never used smokeless tobacco. She reports that she does not drink alcohol and does not use drugs.     Maternal Diabetes: No Genetic Screening: Declined Maternal Ultrasounds/Referrals: Normal Fetal Ultrasounds or other Referrals:  None Maternal Substance Abuse:  No Significant Maternal Medications:  None Significant Maternal Lab Results:  Group B Strep negative Number of Prenatal Visits:greater than 3  verified prenatal visits Other Comments:  None  Review of Systems  Constitutional:  Negative for chills and fever.  Respiratory:  Negative for shortness of breath.   Cardiovascular:  Negative for chest pain, palpitations and leg swelling.  Gastrointestinal:  Negative for abdominal pain and vomiting.  Neurological:  Positive for headaches. Negative for dizziness and weakness.  Psychiatric/Behavioral:  Negative for suicidal ideas.    Maternal Medical History:  Reason for admission: Headache, elevated BP  Contractions: Frequency: regular.   Perceived severity is moderate.   Fetal activity: Perceived fetal activity is normal.   Prenatal complications: PIH.   Prenatal Complications - Diabetes: none.   Dilation: 4 Effacement (%): 70 Station: -3 Exam by:: Felipa Furnace RN Blood pressure (!) 159/94, pulse 84, temperature 98.4 F (36.9 C), temperature source Oral, resp. rate 18, height 5\' 4"  (1.626 m), weight 89.5 kg, SpO2 98 %, unknown if currently breastfeeding. Exam Physical Exam Constitutional:      General: She is not in acute distress.    Appearance: She is well-developed.  HENT:     Head: Normocephalic and atraumatic.  Eyes:     Pupils: Pupils are equal, round, and reactive to light.  Cardiovascular:     Rate and Rhythm: Normal rate and  regular rhythm.     Heart sounds: No murmur heard.    No gallop.  Abdominal:     Tenderness: There is no abdominal tenderness. There is no guarding or rebound.  Genitourinary:    Vagina: Normal.  Musculoskeletal:        General: Normal range of motion.     Cervical back: Normal range of motion and neck supple.  Skin:    General: Skin is warm and dry.  Neurological:     Mental Status: She is alert and oriented to person, place, and time.     Prenatal labs: ABO, Rh:  Bpos Antibody:  neg Rubella:  imm RPR:   nr HBsAg:   neg HIV:   nr GBS:   neg Cat 1 tracing TOCO q3-44m   Assessment/Plan: This is a 31yo G5P4004 @ 38 1/7 by  LMP c/w 9wk scan presenting with worsening BP. H/o elevated BP in last 3 wks, is CHTN, one SRBP in MAU today. PreE labs pending. Admit to L&D in latent labor for worsening CHTN at term -Regular contractions, plan to AROM once on L&D floor.  -Considering no epidural but may get later -GBS neg -Pelvis proven to 7lb6oz Anticipate SVD   Anna Sexton 04/30/2022, 7:59 PM

## 2022-04-30 NOTE — MAU Note (Signed)
Anna Sexton is a 31 y.o. at [redacted]w[redacted]d here in MAU reporting: has hx of HTN and she had an appointment today, reports BP was okay and they drew labs. When she went home her BP was 162/90. Reports she is also having contractions but is unsure about frequency. States she was 3.5cm at her appointment today. No bleeding or LOF. +FM  Onset of complaint: today  Pain score: 6/10 contractions, 4/10 headache  Vitals:   04/30/22 1904  BP: (!) 152/89  Pulse: 75  Resp: 18  Temp: 98.4 F (36.9 C)  SpO2: 98%     FHT:159  Lab orders placed from triage: UA

## 2022-04-30 NOTE — Progress Notes (Signed)
Feeling more painful contractions BP (!) 143/85 (BP Location: Right Arm)   Pulse 73   Temp 97.9 F (36.6 C) (Oral)   Resp 16   Ht 5\' 4"  (1.626 m)   Wt 89.5 kg   SpO2 98% Comment: room air  BMI 33.88 kg/m  Clear AROM @ 2200, CE 6/80/-2, TOCO q76m, Cat 1 tracing  Augment PRN, declining epidural, anticipate SVD. Asymptomatic from HTN standpoint. PreE labs WNL save for ALT at 64 (AST 39). Close eye on status

## 2022-05-01 ENCOUNTER — Encounter (HOSPITAL_COMMUNITY): Payer: Self-pay | Admitting: Obstetrics and Gynecology

## 2022-05-01 LAB — RPR: RPR Ser Ql: NONREACTIVE

## 2022-05-01 LAB — CBC
HCT: 35.6 % — ABNORMAL LOW (ref 36.0–46.0)
Hemoglobin: 11.7 g/dL — ABNORMAL LOW (ref 12.0–15.0)
MCH: 28.3 pg (ref 26.0–34.0)
MCHC: 32.9 g/dL (ref 30.0–36.0)
MCV: 86 fL (ref 80.0–100.0)
Platelets: 266 10*3/uL (ref 150–400)
RBC: 4.14 MIL/uL (ref 3.87–5.11)
RDW: 14.3 % (ref 11.5–15.5)
WBC: 15 10*3/uL — ABNORMAL HIGH (ref 4.0–10.5)
nRBC: 0 % (ref 0.0–0.2)

## 2022-05-01 MED ORDER — ACETAMINOPHEN 325 MG PO TABS
650.0000 mg | ORAL_TABLET | ORAL | Status: DC | PRN
  Administered 2022-05-01 (×2): 650 mg via ORAL
  Filled 2022-05-01 (×2): qty 2

## 2022-05-01 MED ORDER — NIFEDIPINE ER OSMOTIC RELEASE 30 MG PO TB24
30.0000 mg | ORAL_TABLET | Freq: Every day | ORAL | Status: DC
  Administered 2022-05-01: 30 mg via ORAL
  Filled 2022-05-01: qty 1

## 2022-05-01 MED ORDER — PRENATAL MULTIVITAMIN CH
1.0000 | ORAL_TABLET | Freq: Every day | ORAL | Status: DC
  Administered 2022-05-01 – 2022-05-02 (×2): 1 via ORAL
  Filled 2022-05-01 (×2): qty 1

## 2022-05-01 MED ORDER — ZOLPIDEM TARTRATE 5 MG PO TABS: 5.0000 mg | ORAL_TABLET | Freq: Every evening | ORAL | Status: DC | PRN

## 2022-05-01 MED ORDER — DIPHENHYDRAMINE HCL 25 MG PO CAPS: 25.0000 mg | ORAL_CAPSULE | Freq: Four times a day (QID) | ORAL | Status: DC | PRN

## 2022-05-01 MED ORDER — SIMETHICONE 80 MG PO CHEW: 80.0000 mg | CHEWABLE_TABLET | ORAL | Status: DC | PRN

## 2022-05-01 MED ORDER — BENZOCAINE-MENTHOL 20-0.5 % EX AERO: 1.0000 | INHALATION_SPRAY | CUTANEOUS | Status: DC | PRN

## 2022-05-01 MED ORDER — ONDANSETRON HCL 4 MG/2ML IJ SOLN: 4.0000 mg | INTRAMUSCULAR | Status: DC | PRN

## 2022-05-01 MED ORDER — ONDANSETRON HCL 4 MG PO TABS: 4.0000 mg | ORAL_TABLET | ORAL | Status: DC | PRN

## 2022-05-01 MED ORDER — DIBUCAINE (PERIANAL) 1 % EX OINT: 1.0000 | TOPICAL_OINTMENT | CUTANEOUS | Status: DC | PRN

## 2022-05-01 MED ORDER — IBUPROFEN 600 MG PO TABS
600.0000 mg | ORAL_TABLET | Freq: Four times a day (QID) | ORAL | Status: DC
  Administered 2022-05-01 – 2022-05-02 (×6): 600 mg via ORAL
  Filled 2022-05-01 (×7): qty 1

## 2022-05-01 MED ORDER — SENNOSIDES-DOCUSATE SODIUM 8.6-50 MG PO TABS
2.0000 | ORAL_TABLET | Freq: Every day | ORAL | Status: DC
  Administered 2022-05-02: 2 via ORAL
  Filled 2022-05-01: qty 2

## 2022-05-01 MED ORDER — TETANUS-DIPHTH-ACELL PERTUSSIS 5-2.5-18.5 LF-MCG/0.5 IM SUSY: 0.5000 mL | PREFILLED_SYRINGE | Freq: Once | INTRAMUSCULAR | Status: DC

## 2022-05-01 MED ORDER — COCONUT OIL OIL: 1.0000 | TOPICAL_OIL | Status: DC | PRN

## 2022-05-01 MED ORDER — WITCH HAZEL-GLYCERIN EX PADS: 1.0000 | MEDICATED_PAD | CUTANEOUS | Status: DC | PRN

## 2022-05-01 NOTE — Lactation Note (Signed)
This note was copied from a baby's chart. Lactation Consultation Note  Patient Name: Boy Melvia Matousek TKPTW'S Date: 05/01/2022 Reason for consult: Initial assessment (per mom I don't need Lactation Services. LC provided the [facility] resources.) Age:31 hours  Maternal Data    Feeding Mother's Current Feeding Choice: Breast Milk  Consult Status Consult Status: Complete Date: 05/01/22    Kathrin Greathouse 05/01/2022, 9:51 AM

## 2022-05-01 NOTE — Progress Notes (Addendum)
Patient is doing well.  She is ambulating, voiding, tolerating PO.  Pain control is good.  Lochia is appropriate.  C/o carpal tunnel symptoms. Of note, had a temperature elevation of 100.75F at 0640 of which neither nursing nor I were notified.  She reports that she is feeling great this AM overall.  Repeat temp during rounds 59F  Vitals:   05/01/22 0147 05/01/22 0247 05/01/22 0640 05/01/22 0920  BP: (!) 144/85 (!) 148/91 (!) 145/88 (!) 142/84  Pulse: 62 75 69 80  Resp: 18 18 18 18   Temp: 98.9 F (37.2 C) 98.2 F (36.8 C) (!) 100.4 F (38 C) 99 F (37.2 C)  TempSrc: Oral Oral Oral Oral  SpO2: 100% 100% 100% 100%  Weight:      Height:        NAD Fundus firm Ext: trace edema bilaterally  Lab Results  Component Value Date   WBC 15.0 (H) 05/01/2022   HGB 11.7 (L) 05/01/2022   HCT 35.6 (L) 05/01/2022   MCV 86.0 05/01/2022   PLT 266 05/01/2022    --/--/B POS (12/20 1951)  A/P 31 y.o. 38 PPD#1 (delivered at 2355 on 12/20). Routine care.   Worsening chronic hypertension at term--started on procardia Xl 30mg  daily, first dose at approximately 0400.  Will monitor closely today, discussed goal BP Carpal tunnel--discussed anticipated course / timeframe for symptom resolution postpartum Elevated temp x 1 postpartum.  Repeat normal.  VS otherwise normal.  No fundal tenderness.  GBS negative and did not have a prolonged labor--is low risk for PP endometritis.  Will monitor closely Desires neonatal circumcision--discussed with nursery--not yet cleared at this time (too young)  [facility] GEFFEL [place]

## 2022-05-01 NOTE — Progress Notes (Signed)
BP 150/90s immediately following delivery.  Has trended down since nifedipine Xl 30mg  was administered overnight.    Vitals:   05/01/22 0247 05/01/22 0640 05/01/22 0920 05/01/22 1413  BP: (!) 148/91 (!) 145/88 (!) 142/84 139/79  Pulse: 75 69 80 85  Resp: 18 18 18 18   Temp: 98.2 F (36.8 C) (!) 100.4 F (38 C) 99 F (37.2 C) 98.5 F (36.9 C)  TempSrc: Oral Oral Oral Oral  SpO2: 100% 100% 100% 100%  Weight:      Height:       Continue at current dose at this time.  Will continue to monitor   [facility] GEFFEL [place]

## 2022-05-01 NOTE — Social Work (Signed)
[name] was referred for history of anxiety.  * Referral screened out by Clinical Social Worker because none of the following criteria appear to apply:  ~ History of anxiety/depression during this pregnancy, or of post-partum depression following prior delivery.  ~ Diagnosis of anxiety and/or depression within last 3 years OR * [name]'s symptoms currently being treated with medication and/or therapy.  Per chart review, [name] diagnosed prior to December 2020. Per OB records [name] "Anxiety stable on no meds". No noted symptoms during this pregnancy.    Please contact the Clinical Social Worker if needs arise, by [name] request, or if [name] scores greater than 9/yes to question 10 on Edinburgh Postpartum Depression Screen.  Akelia Husted, LCSWA Clinical Social Worker [phone] 

## 2022-05-02 MED ORDER — NIFEDIPINE ER 30 MG PO TB24: 30.0000 mg | ORAL_TABLET | Freq: Every day | ORAL | 1 refills | Status: AC

## 2022-05-02 MED ORDER — IBUPROFEN 600 MG PO TABS: 600.0000 mg | ORAL_TABLET | Freq: Four times a day (QID) | ORAL | 1 refills | Status: AC | PRN

## 2022-05-02 NOTE — Discharge Instructions (Signed)
Call office with any concerns [phone] 

## 2022-05-02 NOTE — Discharge Summary (Signed)
Postpartum Discharge Summary  Date of Service updated      Patient Name: Anna Sexton DOB: October 31, 1990 MRN: [id number]  Date of admission: 04/30/2022 Delivery date:04/30/2022  Delivering provider: Carlisle Cater  Date of discharge: 05/02/2022  Admitting diagnosis: Chronic hypertension affecting pregnancy [O10.919] Intrauterine pregnancy: [redacted]w[redacted]d     Secondary diagnosis:  Principal Problem:   Chronic hypertension affecting pregnancy  Additional problems: none    Discharge diagnosis: Term Pregnancy Delivered and CHTN                                              Post partum procedures: n./a Augmentation: AROM and Pitocin Complications: None  Hospital course: Induction of Labor With Vaginal Delivery   31 y.o. yo [id number] at [redacted]w[redacted]d was admitted to the hospital 04/30/2022 for induction of labor.  Indication for induction:  chtn .  Patient had an labor course complicated by n/a Membrane Rupture Time/Date: 10:07 PM ,04/30/2022   Delivery Method:Vaginal, Spontaneous  Episiotomy: None  Lacerations:  None  Details of delivery can be found in separate delivery note.  Patient had a postpartum course complicated by elevated BP. Patient is discharged home 05/02/22.  Newborn Data: Birth date:04/30/2022  Birth time:11:57 PM  Gender:Female  Living status:Living  Apgars:8 ,9  Weight:3170 g   Magnesium Sulfate received: No BMZ received: No  Physical exam  Vitals:   05/01/22 1822 05/01/22 2144 05/02/22 0144 05/02/22 0530  BP: 134/86 130/78 138/88 124/79  Pulse: 68 73 70 67  Resp: 18 18 18 18   Temp: 98.5 F (36.9 C) 98.7 F (37.1 C) 98.7 F (37.1 C) 98.5 F (36.9 C)  TempSrc: Oral Oral Oral Oral  SpO2: 100%     Weight:      Height:       General: alert, cooperative, and no distress Lochia: appropriate Uterine Fundus: firm Incision: N/A DVT Evaluation: No evidence of DVT seen on physical exam. Labs: Lab Results  Component Value Date   WBC 15.0 (H) 05/01/2022    HGB 11.7 (L) 05/01/2022   HCT 35.6 (L) 05/01/2022   MCV 86.0 05/01/2022   PLT 266 05/01/2022      Latest Ref Rng & Units 04/30/2022    7:51 PM  CMP  Glucose 70 - 99 mg/dL 85   BUN 6 - 20 mg/dL 11   Creatinine 05/02/2022 - 1.00 mg/dL 1.09   Sodium 3.23 - 557 mmol/L 136   Potassium 3.5 - 5.1 mmol/L 3.7   Chloride 98 - 111 mmol/L 105   CO2 22 - 32 mmol/L 20   Calcium 8.9 - 10.3 mg/dL 9.2   Total Protein 6.5 - 8.1 g/dL 6.9   Total Bilirubin 0.3 - 1.2 mg/dL 0.6   Alkaline Phos 38 - 126 U/L 111   AST 15 - 41 U/L 39   ALT 0 - 44 U/L 64    Edinburgh Score:    12/21/2019   10:08 AM  Edinburgh Postnatal Depression Scale Screening Tool  I have been able to laugh and see the funny side of things. 0  I have looked forward with enjoyment to things. 0  I have blamed myself unnecessarily when things went wrong. 0  I have been anxious or worried for no good reason. 0  I have felt scared or panicky for no good reason. 0  Things have been getting on  top of me. 0  I have been so unhappy that I have had difficulty sleeping. 0  I have felt sad or miserable. 0  I have been so unhappy that I have been crying. 0  The thought of harming myself has occurred to me. 0  Edinburgh Postnatal Depression Scale Total 0      After visit meds:  Allergies as of 05/02/2022   No Known Allergies      Medication List     TAKE these medications    acetaminophen 500 MG tablet Commonly known as: TYLENOL Take 500-1,000 mg by mouth daily as needed for mild pain, moderate pain, fever or headache.   ibuprofen 600 MG tablet Commonly known as: ADVIL Take 1 tablet (600 mg total) by mouth every 6 (six) hours as needed for moderate pain or cramping.   NIFEdipine 30 MG 24 hr tablet Commonly known as: ADALAT CC Take 1 tablet (30 mg total) by mouth daily.   prenatal multivitamin Tabs tablet Take 1 tablet by mouth daily.         Discharge home in stable condition Infant Feeding: Breast Infant  Disposition:home with mother Discharge instruction: per After Visit Summary and Postpartum booklet. Activity: Advance as tolerated. Pelvic rest for 6 weeks.  Diet: low salt diet Anticipated Birth Control: Unsure Postpartum Appointment:6 weeks Additional Postpartum F/U: Postpartum Depression checkup and BP check 1 week Future Appointments:No future appointments. Follow up Visit:  Follow-up Information     [facility]. Schedule an appointment as soon as possible for a visit.   Why: One week for BP check and 6 weeks for postpartum visit Contact information: [address]  [address] [place] [address] [phone]                     05/02/2022 Cathrine Muster, DO

## 2022-05-02 NOTE — Progress Notes (Signed)
Post Partum Day 1 Subjective: no complaints, up ad lib, voiding, tolerating PO, + flatus, and lochia mild. She denies HA, CP, SOB. She is bonding well with baby - breastfeeding. Desires circumcision for baby today and discharge home as well . Tolerating procardia well  Objective: Blood pressure 124/79, pulse 67, temperature 98.5 F (36.9 C), temperature source Oral, resp. rate 18, height 5\' 4"  (1.626 m), weight 89.5 kg, SpO2 100 %, unknown if currently breastfeeding.  Physical Exam:  General: alert, cooperative, and no distress Lochia: appropriate Uterine Fundus: firm Incision: n/a DVT Evaluation: No evidence of DVT seen on physical exam.  Recent Labs    04/30/22 1951 05/01/22 0531  HGB 12.3 11.7*  HCT 36.7 35.6*    Assessment/Plan: Discharge home, Breastfeeding, and Circumcision prior to discharge Pt understands that neonatal circumcision is not considered medically necessary and is elective. The risks include, but are not limited to bleeding, infection, damage to the penis, development of scar tissue, and having to have it redone at a later date. Pt understands theses risks and wishes to proceed Plan BP check in a week and pp visit in 6weeks   LOS: 2 days   Anna Beining W Perlie Stene, DO 05/02/2022, 9:45 AM

## 2022-05-10 ENCOUNTER — Telehealth (HOSPITAL_COMMUNITY): Payer: Self-pay

## 2022-05-10 NOTE — Telephone Encounter (Addendum)
Patient reports doing well. Patient has questions regarding her dc paperwork and blood pressure instructions. RN reviewed preeclampsia and warning signs to report to OB. Patient states that she has an appointment coming up with her OB, BP check. RN told patient to call her OB with any concerns or elevated BP. RN talked with patient about MAU at [facility] as well if she ever needed to be seen. Patient declines any other questions/concerns about her health and healing.  Patient reports that baby is doing good. Eating, peeing/pooping, and doing well. Baby sleeps in a bassinet. RN reviewed ABC's of safe sleep with patient. Patient declines any questions or concerns about baby.  EPDS-patient declines wanting to do EPDS. She states that she is doing well.   Marcelino Duster [facility] 05/10/22,1013

## 2022-05-13 DIAGNOSIS — O139 Gestational [pregnancy-induced] hypertension without significant proteinuria, unspecified trimester: Secondary | ICD-10-CM | POA: Diagnosis not present

## 2022-06-10 DIAGNOSIS — Z1331 Encounter for screening for depression: Secondary | ICD-10-CM | POA: Diagnosis not present

## 2022-06-10 DIAGNOSIS — Z3201 Encounter for pregnancy test, result positive: Secondary | ICD-10-CM | POA: Diagnosis not present

## 2022-07-24 ENCOUNTER — Ambulatory Visit: Payer: BC Managed Care – PPO | Admitting: Physical Therapy

## 2022-07-31 DIAGNOSIS — Z0000 Encounter for general adult medical examination without abnormal findings: Secondary | ICD-10-CM | POA: Diagnosis not present

## 2022-09-02 DIAGNOSIS — R739 Hyperglycemia, unspecified: Secondary | ICD-10-CM | POA: Diagnosis not present

## 2022-09-02 DIAGNOSIS — I10 Essential (primary) hypertension: Secondary | ICD-10-CM | POA: Diagnosis not present

## 2022-09-02 DIAGNOSIS — Z13220 Encounter for screening for lipoid disorders: Secondary | ICD-10-CM | POA: Diagnosis not present

## 2022-09-02 DIAGNOSIS — D649 Anemia, unspecified: Secondary | ICD-10-CM | POA: Diagnosis not present

## 2023-02-03 DIAGNOSIS — D509 Iron deficiency anemia, unspecified: Secondary | ICD-10-CM | POA: Diagnosis not present

## 2023-02-03 DIAGNOSIS — I10 Essential (primary) hypertension: Secondary | ICD-10-CM | POA: Diagnosis not present

## 2023-03-24 ENCOUNTER — Other Ambulatory Visit: Payer: Self-pay | Admitting: Family Medicine

## 2023-03-24 DIAGNOSIS — E042 Nontoxic multinodular goiter: Secondary | ICD-10-CM | POA: Diagnosis not present

## 2023-03-24 DIAGNOSIS — E079 Disorder of thyroid, unspecified: Secondary | ICD-10-CM | POA: Diagnosis not present

## 2023-03-24 DIAGNOSIS — L503 Dermatographic urticaria: Secondary | ICD-10-CM | POA: Diagnosis not present

## 2023-03-31 ENCOUNTER — Ambulatory Visit
Admission: RE | Admit: 2023-03-31 | Discharge: 2023-03-31 | Disposition: A | Discharge status: Home / Self Care | Payer: BC Managed Care – PPO | Source: Ambulatory Visit | Attending: Family Medicine | Admitting: Family Medicine

## 2023-03-31 DIAGNOSIS — E042 Nontoxic multinodular goiter: Secondary | ICD-10-CM

## 2023-04-14 ENCOUNTER — Other Ambulatory Visit: Payer: Self-pay | Admitting: Family Medicine

## 2023-04-14 DIAGNOSIS — E041 Nontoxic single thyroid nodule: Secondary | ICD-10-CM

## 2023-11-18 DIAGNOSIS — F419 Anxiety disorder, unspecified: Secondary | ICD-10-CM | POA: Diagnosis not present

## 2023-11-18 DIAGNOSIS — L509 Urticaria, unspecified: Secondary | ICD-10-CM | POA: Diagnosis not present

## 2023-12-03 DIAGNOSIS — Z124 Encounter for screening for malignant neoplasm of cervix: Secondary | ICD-10-CM | POA: Diagnosis not present

## 2023-12-03 DIAGNOSIS — Z1389 Encounter for screening for other disorder: Secondary | ICD-10-CM | POA: Diagnosis not present

## 2023-12-03 DIAGNOSIS — Z130 Encounter for screening for diseases of the blood and blood-forming organs and certain disorders involving the immune mechanism: Secondary | ICD-10-CM | POA: Diagnosis not present

## 2023-12-03 DIAGNOSIS — Z01419 Encounter for gynecological examination (general) (routine) without abnormal findings: Secondary | ICD-10-CM | POA: Diagnosis not present

## 2023-12-17 DIAGNOSIS — Z13220 Encounter for screening for lipoid disorders: Secondary | ICD-10-CM | POA: Diagnosis not present

## 2023-12-17 DIAGNOSIS — Z0000 Encounter for general adult medical examination without abnormal findings: Secondary | ICD-10-CM | POA: Diagnosis not present

## 2023-12-17 DIAGNOSIS — E042 Nontoxic multinodular goiter: Secondary | ICD-10-CM | POA: Diagnosis not present

## 2023-12-17 DIAGNOSIS — B001 Herpesviral vesicular dermatitis: Secondary | ICD-10-CM | POA: Diagnosis not present

## 2023-12-17 DIAGNOSIS — I10 Essential (primary) hypertension: Secondary | ICD-10-CM | POA: Diagnosis not present

## 2023-12-17 DIAGNOSIS — E041 Nontoxic single thyroid nodule: Secondary | ICD-10-CM | POA: Diagnosis not present

## 2023-12-17 DIAGNOSIS — D649 Anemia, unspecified: Secondary | ICD-10-CM | POA: Diagnosis not present

## 2023-12-17 DIAGNOSIS — G43909 Migraine, unspecified, not intractable, without status migrainosus: Secondary | ICD-10-CM | POA: Diagnosis not present

## 2023-12-31 DIAGNOSIS — E042 Nontoxic multinodular goiter: Secondary | ICD-10-CM | POA: Diagnosis not present

## 2023-12-31 DIAGNOSIS — R6884 Jaw pain: Secondary | ICD-10-CM | POA: Diagnosis not present

## 2023-12-31 DIAGNOSIS — B349 Viral infection, unspecified: Secondary | ICD-10-CM | POA: Diagnosis not present

## 2024-02-23 DIAGNOSIS — M79609 Pain in unspecified limb: Secondary | ICD-10-CM | POA: Diagnosis not present

## 2024-02-23 DIAGNOSIS — M549 Dorsalgia, unspecified: Secondary | ICD-10-CM | POA: Diagnosis not present

## 2024-02-23 DIAGNOSIS — Z3202 Encounter for pregnancy test, result negative: Secondary | ICD-10-CM | POA: Diagnosis not present

## 2024-02-23 DIAGNOSIS — Z30430 Encounter for insertion of intrauterine contraceptive device: Secondary | ICD-10-CM | POA: Diagnosis not present

## 2024-02-24 DIAGNOSIS — M549 Dorsalgia, unspecified: Secondary | ICD-10-CM | POA: Diagnosis not present

## 2024-02-24 DIAGNOSIS — M79609 Pain in unspecified limb: Secondary | ICD-10-CM | POA: Diagnosis not present

## 2024-03-17 DIAGNOSIS — B029 Zoster without complications: Secondary | ICD-10-CM | POA: Diagnosis not present

## 2024-03-21 DIAGNOSIS — I10 Essential (primary) hypertension: Secondary | ICD-10-CM | POA: Diagnosis not present

## 2024-03-21 DIAGNOSIS — D509 Iron deficiency anemia, unspecified: Secondary | ICD-10-CM | POA: Diagnosis not present

## 2024-03-21 DIAGNOSIS — Z30430 Encounter for insertion of intrauterine contraceptive device: Secondary | ICD-10-CM | POA: Diagnosis not present

## 2024-03-21 DIAGNOSIS — L509 Urticaria, unspecified: Secondary | ICD-10-CM | POA: Diagnosis not present

## 2024-03-21 DIAGNOSIS — E042 Nontoxic multinodular goiter: Secondary | ICD-10-CM | POA: Diagnosis not present
